# Patient Record
Sex: Male | Born: 1985 | Race: Black or African American | Hispanic: No | Marital: Single | Smoking: Current every day smoker
Health system: Southern US, Community
[De-identification: ages and names within clinical notes are randomized; demographics above are authoritative.]

## PROBLEM LIST (undated history)

## (undated) DIAGNOSIS — F209 Schizophrenia, unspecified: Secondary | ICD-10-CM

## (undated) DIAGNOSIS — F329 Major depressive disorder, single episode, unspecified: Secondary | ICD-10-CM

## (undated) DIAGNOSIS — F32A Depression, unspecified: Secondary | ICD-10-CM

## (undated) DIAGNOSIS — F319 Bipolar disorder, unspecified: Secondary | ICD-10-CM

## (undated) NOTE — *Deleted (*Deleted)
Emergency Medicine Observation Re-evaluation Note  Jeffrey Michael is a 32 y.o. male, seen on rounds today.  Pt initially presented to the ED for complaints of Medication Refill and Psychiatric Evaluation Currently, the patient is ***.  Physical Exam  BP 120/74 (BP Location: Left Arm)   Pulse 60   Temp 98.2 F (36.8 C) (Oral)   Resp 18   SpO2 100%  Physical Exam General: *** Cardiac: *** Lungs: *** Psych: ***  ED Course / MDM  EKG:    I have reviewed the labs performed to date as well as medications administered while in observation.  Recent changes in the last 24 hours include ***.  Plan  Current plan is for ***. Patient {ACTION; IS/IS [id number]} under full IVC at this time.

---

## 2014-07-01 ENCOUNTER — Inpatient Hospital Stay: Payer: Self-pay | Admitting: Psychiatry

## 2014-07-02 LAB — LIPID PANEL
Cholesterol: 88 mg/dL (ref 0–200)
HDL Cholesterol: 38 mg/dL — ABNORMAL LOW (ref 40–60)
LDL CHOLESTEROL, CALC: 40 mg/dL (ref 0–100)
Triglycerides: 50 mg/dL (ref 0–200)
VLDL Cholesterol, Calc: 10 mg/dL (ref 5–40)

## 2014-07-02 LAB — COMPREHENSIVE METABOLIC PANEL
ALBUMIN: 3.4 g/dL (ref 3.4–5.0)
ANION GAP: 7 (ref 7–16)
Alkaline Phosphatase: 69 U/L
BILIRUBIN TOTAL: 0.5 mg/dL (ref 0.2–1.0)
BUN: 8 mg/dL (ref 7–18)
CHLORIDE: 111 mmol/L — AB (ref 98–107)
CO2: 24 mmol/L (ref 21–32)
Calcium, Total: 8.3 mg/dL — ABNORMAL LOW (ref 8.5–10.1)
Creatinine: 0.88 mg/dL (ref 0.60–1.30)
EGFR (African American): >60
EGFR (Non-African Amer.): >60
Glucose: 93 mg/dL (ref 65–99)
OSMOLALITY: 281 (ref 275–301)
POTASSIUM: 3.9 mmol/L (ref 3.5–5.1)
SGOT(AST): 16 U/L (ref 15–37)
SGPT (ALT): 19 U/L
Sodium: 142 mmol/L (ref 136–145)
TOTAL PROTEIN: 6.4 g/dL (ref 6.4–8.2)

## 2014-07-02 LAB — DRUG SCREEN, URINE
AMPHETAMINES, UR SCREEN: NEGATIVE (ref ?–1000)
BENZODIAZEPINE, UR SCRN: NEGATIVE (ref ?–200)
Barbiturates, Ur Screen: NEGATIVE (ref ?–200)
Cannabinoid 50 Ng, Ur ~~LOC~~: NEGATIVE (ref ?–50)
Cocaine Metabolite,Ur ~~LOC~~: NEGATIVE (ref ?–300)
MDMA (ECSTASY) UR SCREEN: NEGATIVE (ref ?–500)
METHADONE, UR SCREEN: NEGATIVE (ref ?–300)
Opiate, Ur Screen: NEGATIVE (ref ?–300)
PHENCYCLIDINE (PCP) UR S: NEGATIVE (ref ?–25)
TRICYCLIC, UR SCREEN: NEGATIVE (ref ?–1000)

## 2014-07-02 LAB — CBC
HCT: 40.1 % (ref 40.0–52.0)
HGB: 12.8 g/dL — ABNORMAL LOW (ref 13.0–18.0)
MCH: 23.7 pg — AB (ref 26.0–34.0)
MCHC: 32 g/dL (ref 32.0–36.0)
MCV: 74 fL — ABNORMAL LOW (ref 80–100)
Platelet: 248 10*3/uL (ref 150–440)
RBC: 5.4 10*6/uL (ref 4.40–5.90)
RDW: 15.7 % — ABNORMAL HIGH (ref 11.5–14.5)
WBC: 4.7 10*3/uL (ref 3.8–10.6)

## 2014-07-02 LAB — HEMOGLOBIN A1C: HEMOGLOBIN A1C: 6.1 % (ref 4.2–6.3)

## 2015-01-22 ENCOUNTER — Encounter (HOSPITAL_COMMUNITY): Payer: Self-pay | Admitting: Cardiology

## 2015-01-22 ENCOUNTER — Emergency Department (HOSPITAL_COMMUNITY): Payer: Medicare Other

## 2015-01-22 ENCOUNTER — Emergency Department (HOSPITAL_COMMUNITY)
Admission: EM | Admit: 2015-01-22 | Discharge: 2015-01-22 | Disposition: A | Discharge status: Home / Self Care | Payer: Medicare Other | Attending: Emergency Medicine | Admitting: Emergency Medicine

## 2015-01-22 DIAGNOSIS — S59912A Unspecified injury of left forearm, initial encounter: Secondary | ICD-10-CM | POA: Diagnosis not present

## 2015-01-22 DIAGNOSIS — Y9289 Other specified places as the place of occurrence of the external cause: Secondary | ICD-10-CM | POA: Diagnosis not present

## 2015-01-22 DIAGNOSIS — S6992XA Unspecified injury of left wrist, hand and finger(s), initial encounter: Secondary | ICD-10-CM | POA: Insufficient documentation

## 2015-01-22 DIAGNOSIS — Y998 Other external cause status: Secondary | ICD-10-CM | POA: Diagnosis not present

## 2015-01-22 DIAGNOSIS — Z87891 Personal history of nicotine dependence: Secondary | ICD-10-CM | POA: Insufficient documentation

## 2015-01-22 DIAGNOSIS — M25532 Pain in left wrist: Secondary | ICD-10-CM | POA: Insufficient documentation

## 2015-01-22 DIAGNOSIS — Y9389 Activity, other specified: Secondary | ICD-10-CM | POA: Diagnosis not present

## 2015-01-22 MED ORDER — OXYCODONE-ACETAMINOPHEN 5-325 MG PO TABS
1.0000 | ORAL_TABLET | Freq: Once | ORAL | Status: AC
  Administered 2015-01-22: 1 via ORAL
  Filled 2015-01-22: qty 1

## 2015-01-22 MED ORDER — HYDROCODONE-ACETAMINOPHEN 5-325 MG PO TABS: 1.0000 | ORAL_TABLET | ORAL | Status: DC | PRN

## 2015-01-22 NOTE — ED Notes (Signed)
Pt verbalized understanding of d/c instructions and prescriptions and has no further questions. Pt stable and VSS upon discharge.

## 2015-01-22 NOTE — ED Provider Notes (Signed)
X-rays viewed by me. Plan analgesics, splint, referral hand surgery  Doug SouSam Kavion Mancinas, MD 01/22/15 1701

## 2015-01-22 NOTE — Discharge Instructions (Signed)
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
## 2015-01-22 NOTE — Progress Notes (Signed)
Orthopedic Tech Progress Note Patient Details:  Jeffrey Michael October 15, 1986 [id number]  Ortho Devices Type of Ortho Device: Ace wrap, Thumb spica splint Splint Material: Fiberglass Ortho Device/Splint Location: LUE Ortho Device/Splint Interventions: Ordered, Application   Jennye MoccasinHughes, Hutch Rhett Craig 01/22/2015, 5:53 PM
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
## 2015-07-26 ENCOUNTER — Encounter (HOSPITAL_COMMUNITY): Payer: Self-pay

## 2015-07-26 ENCOUNTER — Emergency Department (HOSPITAL_COMMUNITY)
Admission: EM | Admit: 2015-07-26 | Discharge: 2015-07-26 | Disposition: A | Discharge status: Home / Self Care | Payer: Medicare Other | Attending: Emergency Medicine | Admitting: Emergency Medicine

## 2015-07-26 DIAGNOSIS — W260XXA Contact with knife, initial encounter: Secondary | ICD-10-CM | POA: Insufficient documentation

## 2015-07-26 DIAGNOSIS — S61012A Laceration without foreign body of left thumb without damage to nail, initial encounter: Secondary | ICD-10-CM | POA: Insufficient documentation

## 2015-07-26 DIAGNOSIS — Z79899 Other long term (current) drug therapy: Secondary | ICD-10-CM | POA: Insufficient documentation

## 2015-07-26 DIAGNOSIS — Z87891 Personal history of nicotine dependence: Secondary | ICD-10-CM | POA: Insufficient documentation

## 2015-07-26 DIAGNOSIS — Y93G9 Activity, other involving cooking and grilling: Secondary | ICD-10-CM | POA: Diagnosis not present

## 2015-07-26 DIAGNOSIS — F319 Bipolar disorder, unspecified: Secondary | ICD-10-CM | POA: Diagnosis not present

## 2015-07-26 DIAGNOSIS — F209 Schizophrenia, unspecified: Secondary | ICD-10-CM | POA: Diagnosis not present

## 2015-07-26 DIAGNOSIS — Y998 Other external cause status: Secondary | ICD-10-CM | POA: Diagnosis not present

## 2015-07-26 DIAGNOSIS — S6992XA Unspecified injury of left wrist, hand and finger(s), initial encounter: Secondary | ICD-10-CM | POA: Diagnosis present

## 2015-07-26 DIAGNOSIS — Y9289 Other specified places as the place of occurrence of the external cause: Secondary | ICD-10-CM | POA: Insufficient documentation

## 2015-07-26 DIAGNOSIS — IMO0002 Reserved for concepts with insufficient information to code with codable children: Secondary | ICD-10-CM

## 2015-07-26 HISTORY — DX: Major depressive disorder, single episode, unspecified: F32.9

## 2015-07-26 HISTORY — DX: Bipolar disorder, unspecified: F31.9

## 2015-07-26 HISTORY — DX: Depression, unspecified: F32.A

## 2015-07-26 HISTORY — DX: Schizophrenia, unspecified: F20.9

## 2015-07-26 MED ORDER — LIDOCAINE-EPINEPHRINE 2 %-1:100000 IJ SOLN
20.0000 mL | Freq: Once | INTRAMUSCULAR | Status: DC
  Filled 2015-07-26: qty 1

## 2015-07-26 NOTE — ED Notes (Signed)
Pt has a laceration above his left thumb while he was cooking
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
## 2015-07-26 NOTE — ED Provider Notes (Signed)
CSN: [id number]     Arrival date & time 07/26/15  2020 History  This chart was scribed for Earley Favor, NP, working with Raeford Razor, MD by Chestine Spore, ED Scribe. The patient was seen in room WTR5/WTR5 at 8:45 PM.    Chief Complaint  Patient presents with  . Extremity Laceration      The history is provided by the patient. No language interpreter was used.    Jeffrey Michael is a 29 y.o. male who presents to the Emergency Department via EMS complaining of left thumb laceration onset 7:30 PM PTA. Pt reports that he cut his left thumb while trying to filet a fish with a filet knife. Pt notes that his tetanus was last updated in 2013-2014. He states that he is having associated symptoms of wound. He denies color change, joint swelling, and any other symptoms.   Past Medical History  Diagnosis Date  . Schizophrenia   . Bipolar 1 disorder   . Depression    History reviewed. No pertinent past surgical history. History reviewed. No pertinent family history. Social History  Substance Use Topics  . Smoking status: Former Games developer  . Smokeless tobacco: None  . Alcohol Use: Yes    Review of Systems  Musculoskeletal: Negative for joint swelling and arthralgias.  Skin: Positive for wound. Negative for color change and rash.      Allergies  Review of patient's allergies indicates no known allergies.  Home Medications   Prior to Admission medications   Medication Sig Start Date End Date Taking? Authorizing Provider  haloperidol (HALDOL) 10 MG tablet Take 10 mg by mouth every evening.    Historical Provider, MD  haloperidol decanoate (HALDOL DECANOATE) 100 MG/ML injection Inject into the muscle every 28 (twenty-eight) days.    Historical Provider, MD  HYDROcodone-acetaminophen (NORCO/VICODIN) 5-325 MG per tablet Take 1 tablet by mouth every 4 (four) hours as needed for moderate pain or severe pain. 01/22/15   Jon Gills, MD  sertraline (ZOLOFT) 100 MG tablet Take 100 mg by mouth every  evening.     Historical Provider, MD  traZODone (DESYREL) 100 MG tablet Take 100 mg by mouth at bedtime. 11/29/14   Historical Provider, MD   BP 116/62 mmHg  Pulse 90  Temp(Src) 98 F (36.7 C) (Oral)  Resp 18  SpO2 98% Physical Exam  Constitutional: He is oriented to person, place, and time. He appears well-developed and well-nourished. No distress.  HENT:  Head: Normocephalic and atraumatic.  Eyes: EOM are normal.  Neck: Neck supple. No tracheal deviation present.  Cardiovascular: Normal rate.   Pulmonary/Chest: Effort normal. No respiratory distress.  Musculoskeletal: Normal range of motion.  Neurological: He is alert and oriented to person, place, and time.  Skin: Skin is warm and dry. Laceration noted.  1 cm laceration to the base of the left thumb. Full ROM. Clean margins.  Psychiatric: He has a normal mood and affect. His behavior is normal.  Nursing note and vitals reviewed.   ED Course  Procedures (including critical care time) DIAGNOSTIC STUDIES: Oxygen Saturation is 100% on {room air, {normal,  by my interpretation.    COORDINATION OF CARE: 8:47 PM Discussed treatment plan with pt at bedside and pt agreed to plan.   LACERATION REPAIR PROCEDURE NOTE The patient's identification was confirmed and consent was obtained. This procedure was performed by Earley Favor, NP at 9:31 PM. Site: Base of left thumb Sterile procedures observed: YES Anesthetic used (type and amt): 2 % Lidocaine with Epinephrine  and 1 cc used Suture type/size:3-0 Prolene Length: 1 cm # of Sutures: 3 Technique:Single interrupted Complexity: SImple Antibx ointment applied: BACITRICIN  Tetanus UTD or ordered: NO Site anesthetized, irrigated with NS, explored without evidence of foreign body, wound well approximated, site covered with dry, sterile dressing.  Patient tolerated procedure well without complications. Instructions for care discussed verbally and patient provided with additional  written instructions for homecare and f/u.   Labs Review Labs Reviewed - No data to display  Imaging Review No results found. I have personally reviewed and evaluated these images and lab results as part of my medical decision-making.   EKG Interpretation None     Patient was instructed to keep the area clean and dry.  Wash with soap and water daily, apply thin coat out antibiotically ointment and have the sutures removed in 10 days MDM   Final diagnoses:  None  I personally performed the services described in this documentation, which was scribed in my presence. The recorded information has been reviewed and is accurate.   Earley Favor, NP 07/26/15 2200  Raeford Razor, MD 07/27/15 [phone]

## 2015-07-26 NOTE — Discharge Instructions (Signed)
He can wash the suture site with soap and water once a day, apply a thin coat of antivirals appointment daily, sutures should be removed in 10 days

## 2015-08-04 ENCOUNTER — Emergency Department (HOSPITAL_COMMUNITY)
Admission: EM | Admit: 2015-08-04 | Discharge: 2015-08-04 | Disposition: A | Discharge status: Home / Self Care | Payer: Medicare Other | Attending: Emergency Medicine | Admitting: Emergency Medicine

## 2015-08-04 DIAGNOSIS — Z87891 Personal history of nicotine dependence: Secondary | ICD-10-CM | POA: Diagnosis not present

## 2015-08-04 DIAGNOSIS — F319 Bipolar disorder, unspecified: Secondary | ICD-10-CM | POA: Diagnosis not present

## 2015-08-04 DIAGNOSIS — Z4802 Encounter for removal of sutures: Secondary | ICD-10-CM | POA: Insufficient documentation

## 2015-08-04 DIAGNOSIS — F209 Schizophrenia, unspecified: Secondary | ICD-10-CM | POA: Insufficient documentation

## 2015-08-04 NOTE — ED Provider Notes (Signed)
CSN: [id number]     Arrival date & time 08/04/15  1135 History  This chart was scribed for non-physician practitioner working with Margarita Grizzle, MD, by Jarvis Morgan, ED Scribe. This patient was seen in room TR07C/TR07C and the patient's care was started at 12:19 PM.     Chief Complaint  Patient presents with  . Suture / Staple Removal    The history is provided by the patient. No language interpreter was used.    HPI Comments: Jeffrey Michael is a 29 y.o. male who presents to the Emergency Department for a suture removal of his left hand. Pt initially lacerated the left hand 9 days ago with a filet knife while trying to filet a fish. There were 3 sutures placed in the ED at that time. He states the area is healing well. He denies nay redness, discharge or fever, numbness or weakness       Past Medical History  Diagnosis Date  . Schizophrenia   . Bipolar 1 disorder   . Depression    No past surgical history on file. No family history on file. Social History  Substance Use Topics  . Smoking status: Former Games developer  . Smokeless tobacco: Not on file  . Alcohol Use: Yes    Review of Systems A 10 point review of systems was completed and was negative except for pertinent positives and negatives as mentioned in the history of present illness     Allergies  Review of patient's allergies indicates no known allergies.  Home Medications   Prior to Admission medications   Medication Sig Start Date End Date Taking? Authorizing Provider  haloperidol (HALDOL) 10 MG tablet Take 10 mg by mouth every evening.    Historical Provider, MD  haloperidol decanoate (HALDOL DECANOATE) 100 MG/ML injection Inject into the muscle every 28 (twenty-eight) days.    Historical Provider, MD  HYDROcodone-acetaminophen (NORCO/VICODIN) 5-325 MG per tablet Take 1 tablet by mouth every 4 (four) hours as needed for moderate pain or severe pain. 01/22/15   Jon Gills, MD  sertraline (ZOLOFT) 100 MG tablet Take 100  mg by mouth every evening.     Historical Provider, MD  traZODone (DESYREL) 100 MG tablet Take 100 mg by mouth at bedtime. 11/29/14   Historical Provider, MD   BP 138/84 mmHg  Pulse 77  Temp(Src) 97.8 F (36.6 C) (Oral)  Resp 16  SpO2 100% Physical Exam  Constitutional:  Awake, alert, nontoxic appearance.  HENT:  Head: Atraumatic.  Eyes: Right eye exhibits no discharge. Left eye exhibits no discharge.  Neck: Neck supple.  Pulmonary/Chest: Effort normal. He exhibits no tenderness.  Abdominal: Soft. There is no tenderness. There is no rebound.  Musculoskeletal: He exhibits no tenderness.  Baseline ROM, no obvious new focal weakness.  Neurological:  Mental status and motor strength appears baseline for patient and situation.  Skin: No rash noted.  Well-healing wound to dorsum of left hand. 3 prolene sutures in place. No cellulitis or other evidence of infection  Psychiatric: He has a normal mood and affect.  Nursing note and vitals reviewed.   ED Course  Procedures (including critical care time) SUTURE REMOVAL Performed by: Sharlene Motts  Consent: Verbal consent obtained. Patient identity confirmed: provided demographic data Time out: Immediately prior to procedure a "time out" was called to verify the correct patient, procedure, equipment, support staff and site/side marked as required.  Location details: Dorsum left hand  Wound Appearance: clean  Sutures/Staples Removed: 3   Facility: sutures  placed in this facility Patient tolerance: Patient tolerated the procedure well with no immediate complications.   Labs Review Labs Reviewed - No data to display  Imaging Review No results found. I have personally reviewed and evaluated these images and lab results as part of my medical decision-making.   EKG Interpretation None      MDM  Patient presents for suture removal. The wound is well healed without signs of infection.  The sutures are removed. Wound care  and activity instructions given. Return prn. Vital stable, patient appropriate for discharge to follow-up with doctors as needed. Final diagnoses:  Visit for suture removal        Joycie Peek, PA-C 08/04/15 1439  Margarita Grizzle, MD 08/05/15 1213

## 2015-08-04 NOTE — Discharge Instructions (Signed)

## 2015-08-04 NOTE — ED Notes (Signed)
Sutures intact left hand. Healing well.

## 2018-02-11 ENCOUNTER — Other Ambulatory Visit: Payer: Self-pay

## 2018-02-11 ENCOUNTER — Encounter (HOSPITAL_COMMUNITY): Payer: Self-pay | Admitting: *Deleted

## 2018-02-11 ENCOUNTER — Emergency Department (HOSPITAL_COMMUNITY)
Admission: EM | Admit: 2018-02-11 | Discharge: 2018-02-11 | Disposition: A | Discharge status: Home / Self Care | Payer: Medicare Other | Attending: Emergency Medicine | Admitting: Emergency Medicine

## 2018-02-11 DIAGNOSIS — Z79899 Other long term (current) drug therapy: Secondary | ICD-10-CM | POA: Insufficient documentation

## 2018-02-11 DIAGNOSIS — M779 Enthesopathy, unspecified: Secondary | ICD-10-CM | POA: Insufficient documentation

## 2018-02-11 DIAGNOSIS — Z87891 Personal history of nicotine dependence: Secondary | ICD-10-CM | POA: Insufficient documentation

## 2018-02-11 DIAGNOSIS — M79605 Pain in left leg: Secondary | ICD-10-CM | POA: Diagnosis present

## 2018-02-11 MED ORDER — IBUPROFEN 600 MG PO TABS: 600.0000 mg | ORAL_TABLET | Freq: Four times a day (QID) | ORAL | 0 refills | Status: DC | PRN

## 2018-02-11 NOTE — ED Triage Notes (Signed)
Pt reports onset  Yesterday of pain to left ankle and left knee. Pt denies any specific injury. Ambulatory at triage. No swelling noted to leg.

## 2018-02-11 NOTE — Discharge Instructions (Signed)
Please read attached information. If you experience any new or worsening signs or symptoms please return to the emergency room for evaluation. Please follow-up with your primary care provider or specialist as discussed. Please use medication prescribed only as directed and discontinue taking if you have any concerning signs or symptoms.   °

## 2018-02-11 NOTE — ED Provider Notes (Signed)
[facility] EMERGENCY DEPARTMENT Provider Note   CSN: [id number] Arrival date & time: 02/11/18  1247   History   Chief Complaint Chief Complaint  Patient presents with  . Leg Pain    HPI Jeffrey Michael is a 32 y.o. male.  HPI   32 year old male presents today with left leg pain.  Patient notes pain at the left lateral knee worse with movement no swelling redness or warmth.  No trauma.  No medications prior to arrival.    Past Medical History:  Diagnosis Date  . Bipolar 1 disorder (HCC)   . Depression   . Schizophrenia (HCC)     There are no active problems to display for this patient.   History reviewed. No pertinent surgical history.     Home Medications    Prior to Admission medications   Medication Sig Start Date End Date Taking? Authorizing Provider  haloperidol (HALDOL) 10 MG tablet Take 10 mg by mouth every evening.    [provider]  haloperidol decanoate (HALDOL DECANOATE) 100 MG/ML injection Inject into the muscle every 28 (twenty-eight) days.    [provider]  HYDROcodone-acetaminophen (NORCO/VICODIN) 5-325 MG per tablet Take 1 tablet by mouth every 4 (four) hours as needed for moderate pain or severe pain. 01/22/15   Jon Gills, MD  ibuprofen (ADVIL,MOTRIN) 600 MG tablet Take 1 tablet (600 mg total) by mouth every 6 (six) hours as needed. 02/11/18   Kanita Delage, Tinnie Gens, PA-C  sertraline (ZOLOFT) 100 MG tablet Take 100 mg by mouth every evening.     [provider]  traZODone (DESYREL) 100 MG tablet Take 100 mg by mouth at bedtime. 11/29/14   [provider]    Family History History reviewed. No pertinent family history.  Social History Social History   Tobacco Use  . Smoking status: Former Smoker  Substance Use Topics  . Alcohol use: Yes  . Drug use: Yes    Types: Marijuana     Allergies   Patient has no known allergies.  Review of Systems Review of Systems  All other systems reviewed  and are negative.  Physical Exam Updated Vital Signs BP 124/85 (BP Location: Right Arm)   Pulse 94   Temp 98 F (36.7 C) (Oral)   Resp 16   Ht 5\' 11"  (1.803 m)   Wt 65.8 kg (145 lb)   SpO2 99%   BMI 20.22 kg/m    Physical Exam  Constitutional: He is oriented to person, place, and time. He appears well-developed and well-nourished.  HENT:  Head: Normocephalic and atraumatic.  Eyes: Conjunctivae are normal. Pupils are equal, round, and reactive to light. Right eye exhibits no discharge. Left eye exhibits no discharge. No scleral icterus.  Neck: Normal range of motion. No JVD present. No tracheal deviation present.  Pulmonary/Chest: Effort normal. No stridor.  Musculoskeletal:  Tenderness palpation left lateral knee and lateral musculature, no swelling or edema, full active range of motion no laxity no signs of trauma  Neurological: He is alert and oriented to person, place, and time. Coordination normal.  Psychiatric: He has a normal mood and affect. His behavior is normal. Judgment and thought content normal.  Nursing note and vitals reviewed.   ED Treatments / Results  Labs (all labs ordered are listed, but only abnormal results are displayed) Labs Reviewed - No data to display  EKG  EKG Interpretation None       Radiology No results found.  Procedures Procedures (including critical care time)  Medications Ordered in ED Medications - No data to display   Initial Impression / Assessment and Plan / ED Course  I have reviewed the triage vital signs and the nursing notes.  Pertinent labs & imaging results that were available during my care of the patient were reviewed by me and considered in my medical decision making (see chart for details).      Final Clinical Impressions(s) / ED Diagnoses   Final diagnoses:  Tendinitis   Labs:   Imaging:  Consults:  Therapeutics:  Discharge Meds: Ibuprofen  Assessment/Plan: 32 year old male with likely  tendinitis.  Symptomatic care instructions given strict return precautions given.  He verbalized understanding and agreement to today's plan.    ED Discharge Orders        Ordered    ibuprofen (ADVIL,MOTRIN) 600 MG tablet  Every 6 hours PRN     02/11/18 1441       Eyvonne MechanicHedges, Bethzy Hauck, PA-C 02/11/18 1727    Long, Arlyss RepressJoshua G, MD 02/11/18 2312

## 2018-03-16 ENCOUNTER — Other Ambulatory Visit: Payer: Self-pay

## 2018-03-16 ENCOUNTER — Encounter (HOSPITAL_COMMUNITY): Payer: Self-pay | Admitting: Emergency Medicine

## 2018-03-16 ENCOUNTER — Emergency Department (HOSPITAL_COMMUNITY)
Admission: EM | Admit: 2018-03-16 | Discharge: 2018-03-16 | Disposition: A | Discharge status: Home / Self Care | Payer: Medicare Other | Attending: Emergency Medicine | Admitting: Emergency Medicine

## 2018-03-16 DIAGNOSIS — Y93E8 Activity, other personal hygiene: Secondary | ICD-10-CM | POA: Insufficient documentation

## 2018-03-16 DIAGNOSIS — Z79899 Other long term (current) drug therapy: Secondary | ICD-10-CM | POA: Insufficient documentation

## 2018-03-16 DIAGNOSIS — Y999 Unspecified external cause status: Secondary | ICD-10-CM | POA: Insufficient documentation

## 2018-03-16 DIAGNOSIS — Y929 Unspecified place or not applicable: Secondary | ICD-10-CM | POA: Insufficient documentation

## 2018-03-16 DIAGNOSIS — F17200 Nicotine dependence, unspecified, uncomplicated: Secondary | ICD-10-CM | POA: Diagnosis not present

## 2018-03-16 DIAGNOSIS — T161XXA Foreign body in right ear, initial encounter: Secondary | ICD-10-CM | POA: Diagnosis not present

## 2018-03-16 DIAGNOSIS — X58XXXA Exposure to other specified factors, initial encounter: Secondary | ICD-10-CM | POA: Diagnosis not present

## 2018-03-16 DIAGNOSIS — S09301A Unspecified injury of right middle and inner ear, initial encounter: Secondary | ICD-10-CM | POA: Diagnosis present

## 2018-03-16 MED ORDER — OFLOXACIN 0.3 % OT SOLN: 10.0000 [drp] | Freq: Every day | OTIC | 0 refills | Status: AC

## 2018-03-16 NOTE — ED Provider Notes (Signed)
[facility] EMERGENCY DEPARTMENT Provider Note   CSN: [id number] Arrival date & time: 03/16/18  1418     History   Chief Complaint Chief Complaint  Patient presents with  . Foreign Body in Ear    HPI Jeffrey Michael is a 32 y.o. male presenting to the ED with foreign body in his right ear.  Patient states a week ago he was using a Q-tip and lost the cotton and in his right ear.  He states he has been unable to move it since that time.  States he has decreased hearing associated with this and right ear pain.  Denies any drainage.  The history is provided by the patient.    Past Medical History:  Diagnosis Date  . Bipolar 1 disorder (HCC)   . Depression   . Schizophrenia (HCC)     There are no active problems to display for this patient.   History reviewed. No pertinent surgical history.      Home Medications    Prior to Admission medications   Medication Sig Start Date End Date Taking? Authorizing Provider  haloperidol (HALDOL) 10 MG tablet Take 10 mg by mouth every evening.    [provider]  haloperidol decanoate (HALDOL DECANOATE) 100 MG/ML injection Inject into the muscle every 28 (twenty-eight) days.    [provider]  HYDROcodone-acetaminophen (NORCO/VICODIN) 5-325 MG per tablet Take 1 tablet by mouth every 4 (four) hours as needed for moderate pain or severe pain. 01/22/15   Jon Gills, MD  ibuprofen (ADVIL,MOTRIN) 600 MG tablet Take 1 tablet (600 mg total) by mouth every 6 (six) hours as needed. 02/11/18   Hedges, Tinnie Gens, PA-C  ofloxacin (FLOXIN) 0.3 % OTIC solution Place 10 drops into the right ear daily for 7 days. 03/16/18 03/23/18  Doyle Tegethoff, [place] N, PA-C  sertraline (ZOLOFT) 100 MG tablet Take 100 mg by mouth every evening.     [provider]  traZODone (DESYREL) 100 MG tablet Take 100 mg by mouth at bedtime. 11/29/14   [provider]    Family History No family history on file.  Social  History Social History   Tobacco Use  . Smoking status: Current Every Day Smoker  Substance Use Topics  . Alcohol use: Yes  . Drug use: Yes    Types: Marijuana     Allergies   Patient has no known allergies.   Review of Systems Review of Systems  HENT: Positive for ear pain. Negative for ear discharge.        Foreign body right ear  All other systems reviewed and are negative.    Physical Exam Updated Vital Signs BP (!) 135/97 (BP Location: Right Arm)   Pulse 84   Temp (!) 97.5 F (36.4 C) (Oral)   Resp 16   Ht 5\' 11"  (1.803 m)   Wt 65.8 kg (145 lb)   SpO2 100%   BMI 20.22 kg/m   Physical Exam  Constitutional: He appears well-developed and well-nourished.  HENT:  Head: Normocephalic and atraumatic.  Left Ear: Tympanic membrane, external ear and ear canal normal.  Right canal with foreign object visualized.  Unable to visualize TM.  Canal is mildly erythematous, without obvious discharge.  Eyes: Conjunctivae are normal.  Pulmonary/Chest: Effort normal.  Psychiatric: He has a normal mood and affect. His behavior is normal.  Nursing note and vitals reviewed.    ED Treatments / Results  Labs (all labs ordered are listed, but only abnormal results are displayed)  Labs Reviewed - No data to display  EKG None  Radiology No results found.  Procedures .Foreign Body Removal Date/Time: 03/16/2018 3:59 PM Performed by: Lottie Sigman, [place] N, PA-C Authorized by: Kmari Brian, [place] N, PA-C  Consent: Verbal consent obtained. Risks and benefits: risks, benefits and alternatives were discussed Consent given by: patient Patient understanding: patient states understanding of the procedure being performed Patient identity confirmed: verbally with patient Body area: ear Location details: right ear Localization method: ENT speculum Removal mechanism: forceps Complexity: simple 1 objects recovered. Objects recovered: 1 cm peice of cotton Post-procedure assessment:  foreign body removed Patient tolerance: Patient tolerated the procedure well with no immediate complications Comments: Object removed in whole. TM without perforation or effusion noted. Canal with some erythema.   (including critical care time)  Medications Ordered in ED Medications - No data to display   Initial Impression / Assessment and Plan / ED Course  I have reviewed the triage vital signs and the nursing notes.  Pertinent labs & imaging results that were available during my care of the patient were reviewed by me and considered in my medical decision making (see chart for details).     Patient presenting to the ED with cotton tip and in his right ear canal.  Foreign body was removed in whole.  TM visualized following removal, without evidence of perforation or effusion.  Canal with erythema, however no obvious drainage.  Will cover with ofloxacin drops given duration the foreign object was his ear. Return precautions discussed.  Discussed results, findings, treatment and follow up. Patient advised of return precautions. Patient verbalized understanding and agreed with plan.  Final Clinical Impressions(s) / ED Diagnoses   Final diagnoses:  Foreign body of right ear, initial encounter    ED Discharge Orders        Ordered    ofloxacin (FLOXIN) 0.3 % OTIC solution  Daily     03/16/18 1557       Odell Fasching, [place] N, PA-C 03/16/18 1605    Margarita Grizzleay, Danielle, MD 03/16/18 [phone]

## 2018-03-16 NOTE — ED Triage Notes (Signed)
Pt presents top ED with qtip cotton stuck in ear. C/O jaw and tooth pain. Reports small amount of blood in ear.

## 2018-03-16 NOTE — Discharge Instructions (Signed)
Apply 10 drops of the antibiotic to your right ear, once daily, for 1 week. Return to the ER for new or concerning symptoms.

## 2018-06-16 ENCOUNTER — Telehealth: Payer: Self-pay | Admitting: Internal Medicine

## 2018-06-16 ENCOUNTER — Encounter: Payer: Self-pay | Admitting: Internal Medicine

## 2018-06-16 NOTE — Telephone Encounter (Signed)
Pt has been scheduled to see Dr. Arbutus PedMohamed on 8/6 at 1130am. Letter mailed to the pt and faxed to the referring to notify the pt.

## 2018-07-07 ENCOUNTER — Inpatient Hospital Stay: Payer: Medicare Other | Attending: Internal Medicine | Admitting: Internal Medicine

## 2018-07-07 ENCOUNTER — Other Ambulatory Visit: Payer: Self-pay | Admitting: Internal Medicine

## 2018-07-07 DIAGNOSIS — D539 Nutritional anemia, unspecified: Secondary | ICD-10-CM

## 2019-11-18 ENCOUNTER — Emergency Department (HOSPITAL_COMMUNITY): Payer: 59

## 2019-11-18 ENCOUNTER — Encounter (HOSPITAL_COMMUNITY): Payer: Self-pay | Admitting: Emergency Medicine

## 2019-11-18 ENCOUNTER — Emergency Department (HOSPITAL_COMMUNITY)
Admission: EM | Admit: 2019-11-18 | Discharge: 2019-11-18 | Disposition: A | Discharge status: Home / Self Care | Payer: 59 | Attending: Emergency Medicine | Admitting: Emergency Medicine

## 2019-11-18 DIAGNOSIS — R040 Epistaxis: Secondary | ICD-10-CM | POA: Insufficient documentation

## 2019-11-18 DIAGNOSIS — F17200 Nicotine dependence, unspecified, uncomplicated: Secondary | ICD-10-CM | POA: Diagnosis not present

## 2019-11-18 DIAGNOSIS — J3489 Other specified disorders of nose and nasal sinuses: Secondary | ICD-10-CM | POA: Diagnosis present

## 2019-11-18 DIAGNOSIS — B349 Viral infection, unspecified: Secondary | ICD-10-CM

## 2019-11-18 DIAGNOSIS — Z20828 Contact with and (suspected) exposure to other viral communicable diseases: Secondary | ICD-10-CM | POA: Insufficient documentation

## 2019-11-18 DIAGNOSIS — Z79899 Other long term (current) drug therapy: Secondary | ICD-10-CM | POA: Insufficient documentation

## 2019-11-18 DIAGNOSIS — Z20822 Contact with and (suspected) exposure to covid-19: Secondary | ICD-10-CM

## 2019-11-18 LAB — COMPREHENSIVE METABOLIC PANEL
ALT: 32 U/L (ref 0–44)
AST: 23 U/L (ref 15–41)
Albumin: 3.6 g/dL (ref 3.5–5.0)
Alkaline Phosphatase: 75 U/L (ref 38–126)
Anion gap: 7 (ref 5–15)
BUN: 12 mg/dL (ref 6–20)
CO2: 24 mmol/L (ref 22–32)
Calcium: 8.8 mg/dL — ABNORMAL LOW (ref 8.9–10.3)
Chloride: 107 mmol/L (ref 98–111)
Creatinine, Ser: 0.87 mg/dL (ref 0.61–1.24)
GFR calc Af Amer: >60 mL/min (ref >60)
GFR calc non Af Amer: >60 mL/min (ref >60)
Glucose, Bld: 104 mg/dL — ABNORMAL HIGH (ref 70–99)
Potassium: 4.2 mmol/L (ref 3.5–5.1)
Sodium: 138 mmol/L (ref 135–145)
Total Bilirubin: 0.2 mg/dL — ABNORMAL LOW (ref 0.3–1.2)
Total Protein: 6.8 g/dL (ref 6.5–8.1)

## 2019-11-18 LAB — CBC
HCT: 39.6 % (ref 39.0–52.0)
Hemoglobin: 12.3 g/dL — ABNORMAL LOW (ref 13.0–17.0)
MCH: 23.3 pg — ABNORMAL LOW (ref 26.0–34.0)
MCHC: 31.1 g/dL (ref 30.0–36.0)
MCV: 75.1 fL — ABNORMAL LOW (ref 80.0–100.0)
Platelets: 375 10*3/uL (ref 150–400)
RBC: 5.27 MIL/uL (ref 4.22–5.81)
RDW: 15.3 % (ref 11.5–15.5)
WBC: 5.7 10*3/uL (ref 4.0–10.5)
nRBC: 0 % (ref 0.0–0.2)

## 2019-11-18 LAB — INFLUENZA PANEL BY PCR (TYPE A & B)
Influenza A By PCR: NEGATIVE
Influenza B By PCR: NEGATIVE

## 2019-11-18 LAB — RAPID URINE DRUG SCREEN, HOSP PERFORMED
Amphetamines: NOT DETECTED
Barbiturates: NOT DETECTED
Benzodiazepines: NOT DETECTED
Cocaine: NOT DETECTED
Opiates: NOT DETECTED
Tetrahydrocannabinol: NOT DETECTED

## 2019-11-18 LAB — ETHANOL: Alcohol, Ethyl (B): <10 mg/dL (ref <10)

## 2019-11-18 NOTE — ED Notes (Signed)
Per report from offgoing shift, pt refused POC covid swab. PA aware.

## 2019-11-18 NOTE — ED Provider Notes (Signed)
[facility] Provider Note   CSN: [id number] Arrival date & time: 11/18/19  0844     History Chief Complaint  Patient presents with  . URI    Marcelle Overlieric D Knee is a 33 y.o. male history of bipolar, schizophrenia.  Patient presents today for concerns of "the flu".  He reports he has been having cold symptoms off and on for approximately 1 month including runny nose, intermittent cramps and intermittent epistaxis.  He reports that he had an outpatient Covid test recently and does not want to be tested for Covid today he would like to be tested for flu.  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  Patient describes cramps as brief aching cramp-like sensation mild self resolving nonradiating no aggravating or alleviating factors that occurred all different parts of his body at different times over the last  On my evaluation patient denies fever/chills, headache/vision changes, neck stiffness, chest pain/shortness of breath, hemoptysis, cough, abdominal pain, nausea/vomiting, diarrhea, extremity swelling/color change, numbness/tingling, weakness, suicidal ideations, homicidal ideations, hallucinations, drug/alcohol use or any additional concerns.  HPI     Past Medical History:  Diagnosis Date  . Bipolar 1 disorder (HCC)   . Depression   . Schizophrenia (HCC)     There are no problems to display for this patient.   History reviewed. No pertinent surgical history.     History reviewed. No pertinent family history.  Social History   Tobacco Use  . Smoking status: Current Every Day Smoker  . Smokeless tobacco: Never Used  Substance Use Topics  . Alcohol use: Yes  . Drug use: Yes    Types: Marijuana    Home Medications Prior to Admission medications   Medication Sig Start Date End Date Taking? Authorizing Provider  haloperidol (HALDOL) 10 MG tablet Take 10 mg by mouth every  evening.    [provider]  haloperidol decanoate (HALDOL DECANOATE) 100 MG/ML injection Inject into the muscle every 28 (twenty-eight) days.    [provider]  HYDROcodone-acetaminophen (NORCO/VICODIN) 5-325 MG per tablet Take 1 tablet by mouth every 4 (four) hours as needed for moderate pain or severe pain. 01/22/15   Jon GillsWebb, Zach, MD  ibuprofen (ADVIL,MOTRIN) 600 MG tablet Take 1 tablet (600 mg total) by mouth every 6 (six) hours as needed. 02/11/18   Hedges, Tinnie GensJeffrey, PA-C  sertraline (ZOLOFT) 100 MG tablet Take 100 mg by mouth every evening.     [provider]  traZODone (DESYREL) 100 MG tablet Take 100 mg by mouth at bedtime. 11/29/14   [provider]    Allergies    Patient has no known allergies.  Review of Systems   Review of Systems Ten systems are reviewed and are negative for acute change except as noted in the HPI  Physical Exam Updated Vital Signs BP 129/80 (BP Location: Left Arm)   Pulse 81   Temp 98.2 F (36.8 C) (Oral)   Resp 16   SpO2 100%   Physical Exam Constitutional:      General: He is not in acute distress.    Appearance: Normal appearance. He is well-developed. He is not ill-appearing or diaphoretic.  HENT:     Head: Normocephalic and atraumatic.     Right Ear: External ear normal.     Left Ear: External ear normal.     Nose: Nose normal.  Eyes:     General: Vision grossly intact.  Gaze aligned appropriately.     Pupils: Pupils are equal, round, and reactive to light.  Neck:     Trachea: Trachea and phonation normal. No tracheal deviation.  Pulmonary:     Effort: Pulmonary effort is normal. No respiratory distress.  Abdominal:     General: There is no distension.     Palpations: Abdomen is soft.     Tenderness: There is no abdominal tenderness. There is no guarding or rebound.  Musculoskeletal:        General: Normal range of motion.     Cervical back: Normal range of motion.  Skin:    General: Skin is warm and  dry.  Neurological:     Mental Status: He is alert.     GCS: GCS eye subscore is 4. GCS verbal subscore is 5. GCS motor subscore is 6.     Comments: Speech is clear and goal oriented, follows commands Major Cranial nerves without deficit, no facial droop Moves extremities without ataxia, coordination intact  Psychiatric:        Behavior: Behavior normal.        Thought Content: Thought content does not include homicidal or suicidal ideation.     ED Results / Procedures / Treatments   Labs (all labs ordered are listed, but only abnormal results are displayed) Labs Reviewed  COMPREHENSIVE METABOLIC PANEL - Abnormal; Notable for the following components:      Result Value   Glucose, Bld 104 (*)    Calcium 8.8 (*)    Total Bilirubin 0.2 (*)    All other components within normal limits  CBC - Abnormal; Notable for the following components:   Hemoglobin 12.3 (*)    MCV 75.1 (*)    MCH 23.3 (*)    All other components within normal limits  ETHANOL  RAPID URINE DRUG SCREEN, HOSP PERFORMED  INFLUENZA PANEL BY PCR (TYPE A & B)  POC SARS CORONAVIRUS 2 AG -  ED    EKG None  Radiology DG Abdomen Acute W/Chest  Result Date: 11/18/2019 CLINICAL DATA:  Generalized abdominal pain. EXAM: DG ABDOMEN ACUTE W/ 1V CHEST COMPARISON:  None. FINDINGS: There is no evidence of dilated bowel loops or free intraperitoneal air. No radiopaque calculi or other significant radiographic abnormality is seen. Heart size and mediastinal contours are within normal limits. Both lungs are clear. IMPRESSION: Negative abdominal radiographs.  No acute cardiopulmonary disease. Electronically Signed   By: Marijo Conception M.D.   On: 11/18/2019 13:03    Procedures Procedures (including critical care time)  Medications Ordered in ED Medications - No data to display  ED Course  I have reviewed the triage vital signs and the nursing notes.  Pertinent labs & imaging results that were available during my care of  the patient were reviewed by me and considered in my medical decision making (see chart for details).    MDM Rules/Calculators/A&P                      CBC nonacute, platelet count within normal limits, hemoglobin 12.3 slightly decreased MCV and MCH appears similar to prior CMP nonacute Flu panel negative Ethanol negative UDS negative Chest/abdomen x-ray:  IMPRESSION:  Negative abdominal radiographs. No acute cardiopulmonary disease.   - Patient refused COVID-19 test today stating that he already had a test earlier this week.  I am unable to find that test in our system.  I offered patient Covid test multiple times and he still refused.  I advised the patient should continue wearing his mask and self isolate to avoid spread of any potential virus and to continue doing so until symptom-free x7 days.  Patient is without tachycardia or hypoxia on room air he has no increased work of breathing on examination and clear lungs no indication for further work-up at this time.  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  He has no abdominal pain or diarrhea, feel he can follow-up with a primary care provider for further evaluation, no indication for emergent imaging or further testing at this time.  Patient denies any SI or HI, he does not appear to be danger to himself or others and appears safe for discharge at this time.  At this time there does not appear to be any evidence of an acute emergency medical condition and the patient appears stable for discharge with appropriate outpatient follow up. Diagnosis was discussed with patient who verbalizes understanding of care plan and is agreeable to discharge. I have discussed return precautions with patient who verbalizes understanding of return precautions. Patient encouraged to follow-up with their  PCP. All questions answered.  Patient has been discharged in good condition.  Patient's case discussed with Dr. Lockie Mola who agrees with plan to discharge with follow-up.   Jeffrey Michael was evaluated in Emergency Department on 11/18/2019 for the symptoms described in the history of present illness. He was evaluated in the context of the global COVID-19 pandemic, which necessitated consideration that the patient might be at risk for infection with the SARS-CoV-2 virus that causes COVID-19. Institutional protocols and algorithms that pertain to the evaluation of patients at risk for COVID-19 are in a state of rapid change based on information released by regulatory bodies including the CDC and federal and state organizations. These policies and algorithms were followed during the patient's care in the ED.  Note: Portions of this report may have been transcribed using voice recognition software. Every effort was made to ensure accuracy; however, inadvertent computerized transcription errors may still be present. Final Clinical Impression(s) / ED Diagnoses Final diagnoses:  Viral illness  Suspected COVID-19 virus infection    Rx / DC Orders ED Discharge Orders    None       Bill Salinas, PA-C 11/18/19 1322    Virgina Norfolk, DO 11/18/19 1552

## 2019-11-18 NOTE — ED Notes (Signed)
He reports he is allergic to his medications and cannot take them, reports last doses were about a month ago.

## 2019-11-18 NOTE — Discharge Instructions (Addendum)
You have been diagnosed today with Viral illness, suspected COVID-19 virus infection.  At this time there does not appear to be the presence of an emergent medical condition, however there is always the potential for conditions to change. Please read and follow the below instructions.  Please return to the Emergency Department immediately for any new or worsening symptoms. Please be sure to follow up with your Primary Care Provider within one week regarding your visit today; please call their office to schedule an appointment even if you are feeling better for a follow-up visit.  Please call [facility] to establish a primary care provider if you do not already have one. Please continue to wear a mask and to isolate to avoid spread of any potential virus.  Please drink plenty of water and get plenty of rest.  You refused COVID-19 test today, you may follow-up as an outpatient to get a Covid test in our community.  You may return to the ER for further evaluation at any time.  Get help right away if: You have trouble breathing. You have pain or pressure in your chest. You have confusion. You have bluish lips and fingernails. You have difficulty waking from sleep. You have trouble breathing. You have a severe headache or a stiff neck. You have severe vomiting or abdominal pain. You have any new/concerning or worsening of symptoms   Please read the additional information packets attached to your discharge summary.  Do not take your medicine if  develop an itchy rash, swelling in your mouth or lips, or difficulty breathing; call 911 and seek immediate emergency medical attention if this occurs.  Note: Portions of this text may have been transcribed using voice recognition software. Every effort was made to ensure accuracy; however, inadvertent computerized transcription errors may still be present.

## 2019-11-18 NOTE — ED Triage Notes (Signed)
Pt to ER for evaluation of cold symptoms, reports has had a COVID test and was negative. Also reports, he is concerned he has tape worms, reports he has not used drugs in 2 days. When asked what drugs he prefers, he states everything. Vitals are stable. He is calm and cooperative.

## 2020-02-23 ENCOUNTER — Encounter (HOSPITAL_COMMUNITY): Payer: Self-pay | Admitting: Psychiatry

## 2020-02-23 ENCOUNTER — Other Ambulatory Visit: Payer: Self-pay

## 2020-02-23 ENCOUNTER — Inpatient Hospital Stay (HOSPITAL_COMMUNITY)
Admission: AD | Admit: 2020-02-23 | Discharge: 2020-02-28 | DRG: 885 | Disposition: A | Discharge status: Home / Self Care | Payer: 59 | Attending: Psychiatry | Admitting: Psychiatry

## 2020-02-23 DIAGNOSIS — Z79899 Other long term (current) drug therapy: Secondary | ICD-10-CM

## 2020-02-23 DIAGNOSIS — F209 Schizophrenia, unspecified: Secondary | ICD-10-CM | POA: Diagnosis present

## 2020-02-23 DIAGNOSIS — G4700 Insomnia, unspecified: Secondary | ICD-10-CM | POA: Diagnosis present

## 2020-02-23 DIAGNOSIS — Z9114 Patient's other noncompliance with medication regimen: Secondary | ICD-10-CM

## 2020-02-23 DIAGNOSIS — F17210 Nicotine dependence, cigarettes, uncomplicated: Secondary | ICD-10-CM | POA: Diagnosis present

## 2020-02-23 DIAGNOSIS — Z20822 Contact with and (suspected) exposure to covid-19: Secondary | ICD-10-CM | POA: Diagnosis present

## 2020-02-23 DIAGNOSIS — E119 Type 2 diabetes mellitus without complications: Secondary | ICD-10-CM | POA: Diagnosis present

## 2020-02-23 DIAGNOSIS — Z915 Personal history of self-harm: Secondary | ICD-10-CM

## 2020-02-23 DIAGNOSIS — F419 Anxiety disorder, unspecified: Secondary | ICD-10-CM | POA: Diagnosis present

## 2020-02-23 DIAGNOSIS — F29 Unspecified psychosis not due to a substance or known physiological condition: Secondary | ICD-10-CM | POA: Diagnosis present

## 2020-02-23 DIAGNOSIS — F319 Bipolar disorder, unspecified: Principal | ICD-10-CM | POA: Diagnosis present
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
## 2020-02-24 DIAGNOSIS — G4700 Insomnia, unspecified: Secondary | ICD-10-CM | POA: Diagnosis present

## 2020-02-24 DIAGNOSIS — Z79899 Other long term (current) drug therapy: Secondary | ICD-10-CM | POA: Diagnosis not present

## 2020-02-24 DIAGNOSIS — F419 Anxiety disorder, unspecified: Secondary | ICD-10-CM | POA: Diagnosis present

## 2020-02-24 DIAGNOSIS — F319 Bipolar disorder, unspecified: Secondary | ICD-10-CM | POA: Diagnosis present

## 2020-02-24 DIAGNOSIS — Z915 Personal history of self-harm: Secondary | ICD-10-CM | POA: Diagnosis not present

## 2020-02-24 DIAGNOSIS — F209 Schizophrenia, unspecified: Secondary | ICD-10-CM | POA: Diagnosis present

## 2020-02-24 DIAGNOSIS — E119 Type 2 diabetes mellitus without complications: Secondary | ICD-10-CM | POA: Diagnosis present

## 2020-02-24 DIAGNOSIS — Z20822 Contact with and (suspected) exposure to covid-19: Secondary | ICD-10-CM | POA: Diagnosis present

## 2020-02-24 DIAGNOSIS — F29 Unspecified psychosis not due to a substance or known physiological condition: Secondary | ICD-10-CM | POA: Diagnosis present

## 2020-02-24 DIAGNOSIS — Z9114 Patient's other noncompliance with medication regimen: Secondary | ICD-10-CM | POA: Diagnosis not present

## 2020-02-24 DIAGNOSIS — F17210 Nicotine dependence, cigarettes, uncomplicated: Secondary | ICD-10-CM | POA: Diagnosis present

## 2020-02-24 LAB — URINALYSIS, ROUTINE W REFLEX MICROSCOPIC
Bilirubin Urine: NEGATIVE
Glucose, UA: NEGATIVE mg/dL
Hgb urine dipstick: NEGATIVE
Ketones, ur: NEGATIVE mg/dL
Leukocytes,Ua: NEGATIVE
Nitrite: NEGATIVE
Protein, ur: NEGATIVE mg/dL
Specific Gravity, Urine: 1.024 (ref 1.005–1.030)
pH: 5 (ref 5.0–8.0)

## 2020-02-24 LAB — COMPREHENSIVE METABOLIC PANEL
ALT: 25 U/L (ref 0–44)
AST: 21 U/L (ref 15–41)
Albumin: 4.3 g/dL (ref 3.5–5.0)
Alkaline Phosphatase: 80 U/L (ref 38–126)
Anion gap: 7 (ref 5–15)
BUN: 16 mg/dL (ref 6–20)
CO2: 26 mmol/L (ref 22–32)
Calcium: 9.4 mg/dL (ref 8.9–10.3)
Chloride: 105 mmol/L (ref 98–111)
Creatinine, Ser: 0.76 mg/dL (ref 0.61–1.24)
GFR calc Af Amer: >60 mL/min (ref >60)
GFR calc non Af Amer: >60 mL/min (ref >60)
Glucose, Bld: 94 mg/dL (ref 70–99)
Potassium: 4.3 mmol/L (ref 3.5–5.1)
Sodium: 138 mmol/L (ref 135–145)
Total Bilirubin: 0.8 mg/dL (ref 0.3–1.2)
Total Protein: 7.2 g/dL (ref 6.5–8.1)

## 2020-02-24 LAB — RAPID URINE DRUG SCREEN, HOSP PERFORMED
Amphetamines: NOT DETECTED
Barbiturates: NOT DETECTED
Benzodiazepines: NOT DETECTED
Cocaine: NOT DETECTED
Opiates: NOT DETECTED
Tetrahydrocannabinol: NOT DETECTED

## 2020-02-24 LAB — RESPIRATORY PANEL BY RT PCR (FLU A&B, COVID)
Influenza A by PCR: NEGATIVE
Influenza B by PCR: NEGATIVE
SARS Coronavirus 2 by RT PCR: NEGATIVE

## 2020-02-24 LAB — CBC
HCT: 43.9 % (ref 39.0–52.0)
Hemoglobin: 13.4 g/dL (ref 13.0–17.0)
MCH: 23.2 pg — ABNORMAL LOW (ref 26.0–34.0)
MCHC: 30.5 g/dL (ref 30.0–36.0)
MCV: 76 fL — ABNORMAL LOW (ref 80.0–100.0)
Platelets: 288 10*3/uL (ref 150–400)
RBC: 5.78 MIL/uL (ref 4.22–5.81)
RDW: 16.1 % — ABNORMAL HIGH (ref 11.5–15.5)
WBC: 3.3 10*3/uL — ABNORMAL LOW (ref 4.0–10.5)
nRBC: 0 % (ref 0.0–0.2)

## 2020-02-24 LAB — LIPID PANEL
Cholesterol: 112 mg/dL (ref 0–200)
HDL: 35 mg/dL — ABNORMAL LOW (ref >40)
LDL Cholesterol: 63 mg/dL (ref 0–99)
Total CHOL/HDL Ratio: 3.2 RATIO
Triglycerides: 69 mg/dL (ref <150)
VLDL: 14 mg/dL (ref 0–40)

## 2020-02-24 LAB — TSH: TSH: 2.046 u[IU]/mL (ref 0.350–4.500)

## 2020-02-24 LAB — HEMOGLOBIN A1C
Hgb A1c MFr Bld: 6.3 % — ABNORMAL HIGH (ref 4.8–5.6)
Mean Plasma Glucose: 134.11 mg/dL

## 2020-02-24 MED ORDER — OLANZAPINE 10 MG PO TBDP: 10.0000 mg | ORAL_TABLET | Freq: Three times a day (TID) | ORAL | Status: DC | PRN

## 2020-02-24 MED ORDER — CARIPRAZINE HCL 3 MG PO CAPS
3.0000 mg | ORAL_CAPSULE | Freq: Every day | ORAL | Status: DC
  Filled 2020-02-24 (×3): qty 1

## 2020-02-24 MED ORDER — MAGNESIUM HYDROXIDE 400 MG/5ML PO SUSP: 30.0000 mL | Freq: Every day | ORAL | Status: DC | PRN

## 2020-02-24 MED ORDER — ACETAMINOPHEN 325 MG PO TABS: 650.0000 mg | ORAL_TABLET | Freq: Four times a day (QID) | ORAL | Status: DC | PRN

## 2020-02-24 MED ORDER — OLANZAPINE 10 MG PO TBDP
10.0000 mg | ORAL_TABLET | Freq: Every day | ORAL | Status: DC
  Administered 2020-02-24 – 2020-02-28 (×5): 10 mg via ORAL
  Filled 2020-02-24 (×8): qty 1

## 2020-02-24 MED ORDER — ZIPRASIDONE MESYLATE 20 MG IM SOLR: 20.0000 mg | INTRAMUSCULAR | Status: DC | PRN

## 2020-02-24 MED ORDER — HYDROXYZINE HCL 25 MG PO TABS
25.0000 mg | ORAL_TABLET | Freq: Three times a day (TID) | ORAL | Status: DC | PRN
  Administered 2020-02-24 – 2020-02-25 (×2): 25 mg via ORAL
  Filled 2020-02-24 (×2): qty 1

## 2020-02-24 MED ORDER — OLANZAPINE 5 MG PO TBDP
15.0000 mg | ORAL_TABLET | Freq: Every day | ORAL | Status: DC
  Administered 2020-02-24 – 2020-02-27 (×4): 15 mg via ORAL
  Filled 2020-02-24 (×7): qty 3

## 2020-02-24 MED ORDER — LORAZEPAM 1 MG PO TABS: 1.0000 mg | ORAL_TABLET | ORAL | Status: DC | PRN

## 2020-02-24 MED ORDER — ALUM & MAG HYDROXIDE-SIMETH 200-200-20 MG/5ML PO SUSP: 30.0000 mL | ORAL | Status: DC | PRN

## 2020-02-24 MED ORDER — HALOPERIDOL 5 MG PO TABS
5.0000 mg | ORAL_TABLET | Freq: Two times a day (BID) | ORAL | Status: DC
  Filled 2020-02-24 (×6): qty 1

## 2020-02-24 MED ORDER — SERTRALINE HCL 50 MG PO TABS
50.0000 mg | ORAL_TABLET | Freq: Every day | ORAL | Status: DC
  Administered 2020-02-25 – 2020-02-28 (×4): 50 mg via ORAL
  Filled 2020-02-24 (×7): qty 1

## 2020-02-24 MED ORDER — HALOPERIDOL LACTATE 5 MG/ML IJ SOLN
10.0000 mg | Freq: Two times a day (BID) | INTRAMUSCULAR | Status: DC
  Filled 2020-02-24 (×6): qty 2

## 2020-02-24 MED ORDER — TRAZODONE HCL 50 MG PO TABS
50.0000 mg | ORAL_TABLET | Freq: Every evening | ORAL | Status: DC | PRN
  Filled 2020-02-24: qty 1

## 2020-02-24 NOTE — BHH Suicide Risk Assessment (Signed)
[facility] Admission Suicide Risk Assessment   Nursing information obtained from:  Patient Demographic factors:  Male, Low socioeconomic status, Living alone Current Mental Status:  NA Loss Factors:  NA Historical Factors:  NA Risk Reduction Factors:  NA  Total Time spent with patient: 45 minutes Principal Problem: Bipolar I disorder (HCC) Diagnosis:  Principal Problem:   Bipolar I disorder (HCC) Active Problems:   Bipolar 1 disorder (HCC)   Psychosis (HCC)   Schizophrenia (HCC)  Subjective Data: Admission needed due to severity of psychosis  Continued Clinical Symptoms:  Alcohol Use Disorder Identification Test Final Score (AUDIT): 0 The "Alcohol Use Disorders Identification Test", Guidelines for Use in Primary Care, Second Edition.  World Science writer [place]). Score between 0-7:  no or low risk or alcohol related problems. Score between 8-15:  moderate risk of alcohol related problems. Score between 16-19:  high risk of alcohol related problems. Score 20 or above:  warrants further diagnostic evaluation for alcohol dependence and treatment.   CLINICAL FACTORS:   Schizophrenia:   Paranoid or undifferentiated type  Musculoskeletal: Strength & Muscle Tone: within normal limits Gait & Station: normal Patient leans: N/A  Psychiatric Specialty Exam: Physical Exam  Review of Systems  Blood pressure 122/69, pulse 76, temperature 98.6 F (37 C), temperature source Oral, resp. rate 16, SpO2 100 %.There is no height or weight on file to calculate BMI.  General Appearance: Casual  Eye Contact:  Minimal  Speech:  Normal Rate  Volume:  Decreased  Mood:  Anxious and Euthymic  Affect:  Blunt  Thought Process:  Irrelevant and Descriptions of Associations: Circumstantial  Orientation:  Full (Time, Place, and Person)  Thought Content:  Illogical and Delusions  Suicidal Thoughts:  No  Homicidal Thoughts:  No  Memory:  Immediate;   Poor Recent;   Poor Remote;   Poor  Judgement:   Poor  Insight:  Lacking  Psychomotor Activity:  Normal  Concentration:  Concentration: Poor and Attention Span: Poor  Recall:  Poor  Fund of Knowledge:  Poor  Language:  Poor  Akathisia:  Negative  Handed:  Right  AIMS (if indicated):     Assets:  Resilience Social Support  ADL's:  Intact  Cognition:  WNL  Sleep:        COGNITIVE FEATURES THAT CONTRIBUTE TO RISK:  Loss of executive function    SUICIDE RISK:   Minimal: No identifiable suicidal ideation.  Patients presenting with no risk factors but with morbid ruminations; may be classified as minimal risk based on the severity of the depressive symptoms  PLAN OF CARE: Admit for stabilization  I certify that inpatient services furnished can reasonably be expected to improve the patient's condition.   Malvin Johns, MD 02/24/2020, 2:05 PM

## 2020-02-24 NOTE — Tx Team (Signed)
Initial Treatment Plan 02/24/2020 4:54 PM Jeffrey Michael TCY:[id number]    PATIENT STRESSORS: Medication change or noncompliance Substance abuse   PATIENT STRENGTHS: Communication skills Work skills   PATIENT IDENTIFIED PROBLEMS: Medication non-compliance  depresssion  housing                 DISCHARGE CRITERIA:  Improved stabilization in mood, thinking, and/or behavior Verbal commitment to aftercare and medication compliance  PRELIMINARY DISCHARGE PLAN: Outpatient therapy Return to previous living arrangement  PATIENT/FAMILY INVOLVEMENT: This treatment plan has been presented to and reviewed with the patient, Jeffrey Michael, and/or family member, .  The patient and family have been given the opportunity to ask questions and make suggestions.  Wardell Heath, RN 02/24/2020, 4:54 PM

## 2020-02-24 NOTE — Progress Notes (Signed)
BHH Observation Crisis Plan  Reason for Crisis Plan:  Crisis Stabilization   Plan of Care:  Referral for Inpatient Hospitalization and Referral for Telepsychiatry/Psychiatric Consult  Family Support:      Current Living Environment:  Living Arrangements: Other (Comment)(congregate care setting)  Insurance:   Hospital Account    Name Acct ID Class Status Primary Coverage   Jeffrey Michael, Jeffrey Michael [id number] BEHAVIORAL HEALTH OBSERVATION Open UNITED HEALTHCARE MEDICARE - UNITEDHEALTHCARE DUAL COMPLETE        Guarantor Account (for Hospital Account [id number])    Name Relation to Pt Service Area Active? Acct Type   Jeffrey Michael Self [facility] Yes Behavioral Health   Address Phone       [address] [place], [place] [address] [phone](H)          Coverage Information (for Hospital Account [id number])    F/O Payor/Plan Precert #   [facility] MEDICARE/UNITEDHEALTHCARE DUAL COMPLETE    Subscriber Subscriber #   Jeffrey Michael [id number]   Address Phone   [address] [place], [place] [address] [phone]      Legal Guardian:     Primary Care Provider:  System, Provider Not In  Current Outpatient Providers:  N/A  Psychiatrist:  Name of Psychiatrist: None  Counselor/Therapist:  Name of Therapist: None  Compliant with Medications:  No  Additional Information:   Jeffrey Michael 3/25/202112:10 AM

## 2020-02-24 NOTE — Progress Notes (Signed)
   02/24/20 2300  Psych Admission Type (Psych Patients Only)  Admission Status Involuntary  Psychosocial Assessment  Patient Complaints Anxiety  Eye Contact Avoids  Facial Expression Angry  Affect Inconsistent with thought content  Speech Pressured  Interaction Avoidant  Motor Activity Other (Comment) (in bed)  Appearance/Hygiene Unremarkable  Behavior Characteristics Unwilling to participate  Mood Anxious  Thought Process  Coherency WDL  Content WDL  Delusions None reported or observed  Perception WDL  Hallucination UTA  Judgment Limited  Confusion Mild  Danger to Self  Current suicidal ideation? Denies  Danger to Others  Danger to Others None reported or observed  D: Patient in room sleeping most of the evening. Pt interaction is minimal responds by nodding his head sometimes.  A: Medications administered as prescribed. Support and encouragement provided as needed.  R: Patient remains safe on the unit. Will continue to monitor for safety and stability.
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
## 2020-02-24 NOTE — Progress Notes (Signed)
D: Patient Presents with depressed mood and affect.  Patient was annoyed during med pass and refused his medicine without incident saying that he was "allergic" to haldol and zoloft.  Patient isolated in his room.Pt. denies SI/ HI and AVH.  Marland KitchenA:  Patient refused scheduled medicine.  Support and encouragement provided. Routine safety checks conducted every 15 minutes. Patient  Informed to notify staff with any concerns.  Safety maintained  R:  No adverse drug reactions noted.  Patient contracts for safety.  Patient not compliant with medication plan. Safety maintained.

## 2020-02-24 NOTE — BH Assessment (Signed)
BHH Assessment Progress Note  Per [name], MD, this pt requires psychiatric hospitalization.  Jeffrey Michael has assigned pt to [facility] Rm 501-1.  Pt presents under IVC initiated by pt's housing Interior and spatial designer, and upheld by Dr [name].  IVC documents may be found on pt's chart.  Pt's nurse, [name], has been notified.   Doylene Canning, [place] Behavioral Health Coordinator [phone]
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
## 2020-02-24 NOTE — Progress Notes (Signed)
Psychoeducational Group Note  Date:  02/24/2020 Time:  2028  Group Topic/Focus:  Wrap-Up Group:   The focus of this group is to help patients review their daily goal of treatment and discuss progress on daily workbooks.  Participation Level: Did Not Attend  Participation Quality:  Not Applicable  Affect:  Not Applicable  Cognitive:  Not Applicable  Insight:  Not Applicable  Engagement in Group: Not Applicable  Additional Comments: The patient did not attend group this evening.   Hazle Coca S 02/24/2020, 8:28 PM
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
## 2020-02-25 NOTE — Tx Team (Signed)
Interdisciplinary Treatment and Diagnostic Plan Update  02/25/2020 Time of Session: 900am Jeffrey Michael MRN: [id number]  Principal Diagnosis: Bipolar I disorder ([place])  Secondary Diagnoses: Principal Problem:   Bipolar I disorder ([place]) Active Problems:   Bipolar 1 disorder ([place])   Psychosis ([place])   Schizophrenia ([place])   Current Medications:  Current Facility-Administered Medications  Medication Dose Route Frequency Provider Last Rate Last Admin  . acetaminophen (TYLENOL) tablet 650 mg  650 mg Oral Q6H PRN Anike, Adaku C, NP      . alum & mag hydroxide-simeth (MAALOX/MYLANTA) 200-200-20 MG/5ML suspension 30 mL  30 mL Oral Q4H PRN Sharma Covert, MD      . haloperidol (HALDOL) tablet 5 mg  5 mg Oral BID Sharma Covert, MD       Or  . haloperidol lactate (HALDOL) injection 10 mg  10 mg Intramuscular BID Sharma Covert, MD      . hydrOXYzine (ATARAX/VISTARIL) tablet 25 mg  25 mg Oral TID PRN Sharma Covert, MD   25 mg at 02/24/20 1745  . OLANZapine zydis (ZYPREXA) disintegrating tablet 10 mg  10 mg Oral Q8H PRN Sharma Covert, MD       And  . LORazepam (ATIVAN) tablet 1 mg  1 mg Oral PRN Sharma Covert, MD       And  . ziprasidone (GEODON) injection 20 mg  20 mg Intramuscular PRN Sharma Covert, MD      . magnesium hydroxide (MILK OF MAGNESIA) suspension 30 mL  30 mL Oral Daily PRN Sharma Covert, MD      . OLANZapine zydis (ZYPREXA) disintegrating tablet 10 mg  10 mg Oral Daily Johnn Hai, MD   10 mg at 02/24/20 1744  . OLANZapine zydis (ZYPREXA) disintegrating tablet 15 mg  15 mg Oral QHS Johnn Hai, MD   15 mg at 02/24/20 2158  . sertraline (ZOLOFT) tablet 50 mg  50 mg Oral Daily Sharma Covert, MD      . traZODone (DESYREL) tablet 50 mg  50 mg Oral QHS PRN Sharma Covert, MD       PTA Medications: Medications Prior to Admission  Medication Sig Dispense Refill Last Dose  . ibuprofen (ADVIL,MOTRIN) 600 MG tablet Take 1 tablet (600 mg  total) by mouth every 6 (six) hours as needed. 30 tablet 0   . haloperidol (HALDOL) 10 MG tablet Take 10 mg by mouth every evening.   Not Taking at Unknown time  . haloperidol decanoate (HALDOL DECANOATE) 100 MG/ML injection Inject into the muscle every 28 (twenty-eight) days.   Not Taking at Unknown time  . HYDROcodone-acetaminophen (NORCO/VICODIN) 5-325 MG per tablet Take 1 tablet by mouth every 4 (four) hours as needed for moderate pain or severe pain. (Patient not taking: Reported on 02/24/2020) 18 tablet 0 Completed Course at Unknown time  . sertraline (ZOLOFT) 100 MG tablet Take 100 mg by mouth every evening.    Not Taking at Unknown time  . traZODone (DESYREL) 100 MG tablet Take 100 mg by mouth at bedtime.   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   Physician Treatment Plan for Primary Diagnosis: Bipolar I disorder ([place]) Long Term Goal(s): Improvement in symptoms so as ready for discharge Improvement in symptoms so as ready  for discharge   Short Term Goals: Ability to identify changes in lifestyle to reduce recurrence of condition will improve Ability to verbalize feelings will improve Ability to identify changes in lifestyle to reduce recurrence of condition will improve Ability to verbalize feelings will improve Ability to disclose and discuss suicidal ideas Ability to identify and develop effective coping behaviors will improve  Medication Management: Evaluate patient's response, side effects, and tolerance of medication regimen.  Therapeutic Interventions: 1 to 1 sessions, Unit Group sessions and Medication administration.  Evaluation of Outcomes: Not Met  Physician Treatment Plan for Secondary Diagnosis: Principal  Problem:   Bipolar I disorder ([place]) Active Problems:   Bipolar 1 disorder ([place])   Psychosis ([place])   Schizophrenia ([place])  Long Term Goal(s): Improvement in symptoms so as ready for discharge Improvement in symptoms so as ready for discharge   Short Term Goals: Ability to identify changes in lifestyle to reduce recurrence of condition will improve Ability to verbalize feelings will improve Ability to identify changes in lifestyle to reduce recurrence of condition will improve Ability to verbalize feelings will improve Ability to disclose and discuss suicidal ideas Ability to identify and develop effective coping behaviors will improve     Medication Management: Evaluate patient's response, side effects, and tolerance of medication regimen.  Therapeutic Interventions: 1 to 1 sessions, Unit Group sessions and Medication administration.  Evaluation of Outcomes: Not Met   RN Treatment Plan for Primary Diagnosis: Bipolar I disorder ([place]) Long Term Goal(s): Knowledge of disease and therapeutic regimen to maintain health will improve  Short Term Goals: Ability to verbalize frustration and anger appropriately will improve, Ability to demonstrate self-control, Ability to identify and develop effective coping behaviors will improve and Compliance with prescribed medications will improve  Medication Management: RN will administer medications as ordered by provider, will assess and evaluate patient's response and provide education to patient for prescribed medication. RN will report any adverse and/or side effects to prescribing provider.  Therapeutic Interventions: 1 on 1 counseling sessions, Psychoeducation, Medication administration, Evaluate responses to treatment, Monitor vital signs and CBGs as ordered, Perform/monitor CIWA, COWS, AIMS and Fall Risk screenings as ordered, Perform wound care treatments as ordered.  Evaluation of Outcomes: Not Met   LCSW Treatment Plan for Primary  Diagnosis: Bipolar I disorder ([place]) Long Term Goal(s): Safe transition to appropriate next level of care at discharge, Engage patient in therapeutic group addressing interpersonal concerns.  Short Term Goals: Engage patient in aftercare planning with referrals and resources and Increase skills for wellness and recovery  Therapeutic Interventions: Assess for all discharge needs, 1 to 1 time with Social worker, Explore available resources and support systems, Assess for adequacy in community support network, Educate family and significant other(s) on suicide prevention, Complete Psychosocial Assessment, Interpersonal group therapy.  Evaluation of Outcomes: Not Met   Progress in Treatment: Attending groups: No. Participating in groups: No. Taking medication as prescribed: Yes. Toleration medication: Yes. Family/Significant other contact made: No, will contact:  when consent is given Patient understands diagnosis: Yes. Discussing patient identified problems/goals with staff: Yes. Medical problems stabilized or resolved: Yes. Denies suicidal/homicidal ideation: Yes. Issues/concerns per patient self-inventory: No. Other: N/A  New problem(s) identified: No, Describe:  none  New Short Term/Long Term Goal(s): Detox, elimination of AVH/symptoms of psychosis, medication management for mood stabilization; elimination of SI thoughts; development of comprehensive mental wellness/sobriety plan.   Patient Goals:  "change my medicines"  Discharge Plan or Barriers: SPE pamphlet, Mobile Crisis information, and AA/NA information provided  to patient for additional community support and resources. Pt is agreeable to referral to [facility].  Reason for Continuation of Hospitalization: Hallucinations Medication stabilization  Estimated Length of Stay: 3-5 days  Attendees: Patient: Jeffrey Michael 02/25/2020 11:49 AM  Physician: Dr Parke Poisson MD 02/25/2020 11:49 AM  Nursing:  02/25/2020 11:49 AM  RN Care Manager:  02/25/2020 11:49 AM  Social Worker: Minette Brine Joaovictor Krone LCSW 02/25/2020 11:49 AM  Recreational Therapist:  02/25/2020 11:49 AM  Other:  02/25/2020 11:49 AM  Other:  02/25/2020 11:49 AM  Other: 02/25/2020 11:49 AM    Scribe for Treatment Team: Mariann Laster Elinda Bunten, LCSW 02/25/2020 11:49 AM

## 2020-02-25 NOTE — Progress Notes (Signed)
   02/25/20 2200  Psych Admission Type (Psych Patients Only)  Admission Status Voluntary  Psychosocial Assessment  Patient Complaints Anxiety  Eye Contact Suspiciousness;Watchful  Facial Expression Anxious;Worried  Affect Inconsistent with thought content;Preoccupied  Scientist, research (medical) Avoidant  Motor Activity Fidgety  Appearance/Hygiene Unremarkable  Behavior Characteristics Cooperative  Mood Labile;Anxious  Thought Process  Coherency WDL  Content Preoccupation  Delusions None reported or observed  Perception WDL  Hallucination Auditory  Judgment Limited  Confusion Mild  Danger to Self  Current suicidal ideation? Denies  Danger to Others  Danger to Others None reported or observed   Pt continues to be paranoid

## 2020-02-25 NOTE — Progress Notes (Signed)
Adult Psychoeducational Group Note  Date:  02/25/2020 Time:  10:50 PM  Group Topic/Focus:  Wrap-Up Group:   The focus of this group is to help patients review their daily goal of treatment and discuss progress on daily workbooks.  Participation Level:  Minimal  Participation Quality:  Appropriate  Affect:  Appropriate  Cognitive:  Confused  Insight: Lacking  Engagement in Group:  Limited  Modes of Intervention:  Discussion  Additional Comments:  Pt stated his goal for today was to focus on his treatment plan. Pt stated he accomplished his goal today. Pt stated his relationship with his family has improvied since he was admitted. Pt stated he felt better about himself today. Pt rated his overall day a  10. Pt stated his appetite was pretty good today. Pt stated his sleep last night was pretty good. Pt stated he was in no physical pain today.   Pt deny auditory or visual hallucinations. Pt denies thoughts of harming himself or others. Pt stated he would alert staff if anything changes.  Felipa Furnace 02/25/2020, 10:50 PM

## 2020-02-25 NOTE — [facility] Suicide Risk Assessment (Signed)
[facility] INPATIENT:  Family/Significant Other Suicide Prevention Education  Suicide Prevention Education:  Patient Refusal for Family/Significant Other Suicide Prevention Education: The patient Jeffrey Michael has refused to provide written consent for family/significant other to be provided Family/Significant Other Suicide Prevention Education during admission and/or prior to discharge.  Physician notified.  SPE completed with pt, as pt refused to consent to family contact. SPI pamphlet provided to pt and pt was encouraged to share information with support network, ask questions, and talk about any concerns relating to SPE. Pt denies access to guns/firearms and verbalized understanding of information provided. Mobile Crisis information also provided to pt.    Charlann Lange Ravin Bendall MSW LCSW 02/25/2020, 1:17 PM

## 2020-02-25 NOTE — [facility] Counselor (Signed)
Adult Comprehensive Assessment  Patient ID: Jeffrey Michael, male   DOB: May 07, 1986, 34 y.o.   MRN: [id number]  Information Source: Information source: Patient  Current Stressors:  Patient states their primary concerns and needs for treatment are:: "to get my meds changed" Patient states their goals for this hospitilization and ongoing recovery are:: "change my meds" Educational / Learning stressors: GED Employment / Job issues: unemployed Family Relationships: pt reports good familial relationships Housing / Lack of housing: Pt reports he lives in [place] [employer] Physical health (include injuries & life threatening diseases): none reported Substance abuse: Pt denies  Living/Environment/Situation:  Living Arrangements: Other (Comment)([facility]) Living conditions (as described by patient or guardian): "good" Who else lives in the home?: other residents, it is a congregate care facility How long has patient lived in current situation?: "3 months"  Family History:  Marital status: Single Does patient have children?: Yes How many children?: 1 How is patient's relationship with their children?: "not good"  Childhood History:  By whom was/is the patient raised?: Both parents Description of patient's relationship with caregiver when they were a child: "straight" Patient's description of current relationship with people who raised him/her: "half-way" Does patient have siblings?: Yes Number of Siblings: 3 Description of patient's current relationship with siblings: "good" Did patient suffer any verbal/emotional/physical/sexual abuse as a child?: Yes(Pt reports he experienced trauma throughout his life but would not elaborate what kind) Did patient suffer from severe childhood neglect?: No Has patient ever been sexually abused/assaulted/raped as an adolescent or adult?: No Was the patient ever a victim of a crime or a disaster?: No Witnessed domestic violence?:  No Has patient been effected by domestic violence as an adult?: No  Education:  Highest grade of school patient has completed: GED Currently a Consulting civil engineer?: No Learning disability?: No  Employment/Work Situation:   Employment situation: Unemployed What is the longest time patient has a held a job?: "A couple years" 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 You Receive Any Psychiatric Treatment/Services While in Equities trader?: No Are There Guns or Other Weapons in Your Home?: No Are These Comptroller?: (N/A)  Financial Resources:   Financial resources: Sales executive, Medicare Does patient have a Lawyer or guardian?: No  Alcohol/Substance Abuse:   What has been your use of drugs/alcohol within the last 12 months?: Pt denies If attempted suicide, did drugs/alcohol play a role in this?: No Alcohol/Substance Abuse Treatment Hx: Denies past history Has alcohol/substance abuse ever caused legal problems?: No  Social Support System:   Forensic psychologist System: (Pt reports "not bad") Type of faith/religion: N/A  Leisure/Recreation:   Leisure and Hobbies: "none"  Strengths/Needs:   What is the patient's perception of their strengths?: Pt reports "reading" Patient states they can use these personal strengths during their treatment to contribute to their recovery: Pt did not respond Patient states these barriers may affect/interfere with their treatment: pt denies Patient states these barriers may affect their return to the community: none reported  Discharge Plan:   Currently receiving community mental health services: No Patient states concerns and preferences for aftercare planning are: Pt is agreeable to referral to [facility] Patient states they will know when they are safe and ready for discharge when: "I don't know" Does patient have access to transportation?: Yes Does patient have financial barriers related to discharge  medications?: No Will patient be returning to same living situation after discharge?: Yes  Summary/Recommendations:   Summary and Recommendations (  to be completed by the evaluator): Pt is a 34 yo male living in [place], [place] ([place] in a concregate care facility. Pt presents to the hospital seeking treatment for medication stabilization, verbal and phsyical agression. Pt has a diagnosis of Bipolar I disorder. Pt is single, unemployed, has one child, reports history of trauma, and reports a decent supoprt system. Pt denies SI/HI/AVH currently. Pt is agreeable to referral to [facility] for outpatient mental health treatment. Recommendations for pt include: crisis stabilization, therapeutic milieu, encourage group attendance and participation, medication management for mood stabilization, and development for comprehensive mental wellness plan. CSW assessing for appropriate referrals.  [place] MSW LCSW 02/25/2020 1:17 PM

## 2020-02-25 NOTE — [facility] Group Notes (Signed)
LCSW Group Therapy Note  02/25/2020 10:28 AM  Type of Therapy and Topic:  Group Therapy:  Feelings around Relapse and Recovery  Participation Level:  Did Not Attend   Description of Group:    Patients in this group will discuss emotions they experience before and after a relapse. They will process how experiencing these feelings, or avoidance of experiencing them, relates to having a relapse. Facilitator will guide patients to explore emotions they have related to recovery. Patients will be encouraged to process which emotions are more powerful. They will be guided to discuss the emotional reaction significant others in their lives may have to their relapse or recovery. Patients will be assisted in exploring ways to respond to the emotions of others without this contributing to a relapse.  Therapeutic Goals: 1. Patient will identify two or more emotions that lead to a relapse for them 2. Patient will identify two emotions that result when they relapse 3. Patient will identify two emotions related to recovery 4. Patient will demonstrate ability to communicate their needs through discussion and/or role plays   Summary of Patient Progress: x   Therapeutic Modalities:   Cognitive Behavioral Therapy Solution-Focused Therapy Assertiveness Training Relapse Prevention Therapy   Iris Pert, MSW, LCSW Clinical Social Work 02/25/2020 10:28 AM
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
## 2020-02-25 NOTE — Progress Notes (Deleted)
   02/25/20 1600  Psych Admission Type (Psych Patients Only)  Admission Status Voluntary  Psychosocial Assessment  Patient Complaints Anxiety  Eye Contact Suspiciousness;Watchful  Facial Expression Anxious;Worried  Affect Inconsistent with thought content;Preoccupied  Speech Pressured  Interaction Avoidant  Motor Activity Fidgety  Appearance/Hygiene Unremarkable  Behavior Characteristics Cooperative  Mood Anxious;Labile;Suspicious  Thought Process  Coherency WDL  Content Preoccupation  Delusions None reported or observed  Perception WDL  Hallucination Auditory  Judgment Limited  Confusion Mild  Danger to Self  Current suicidal ideation? Denies  Danger to Others  Danger to Others None reported or observed   

## 2020-02-25 NOTE — Progress Notes (Signed)
   02/25/20 1600  Psych Admission Type (Psych Patients Only)  Admission Status Voluntary  Psychosocial Assessment  Patient Complaints Anxiety  Eye Contact Suspiciousness;Watchful  Facial Expression Anxious;Worried  Affect Inconsistent with thought content;Preoccupied  Scientist, research (medical) Avoidant  Motor Activity Fidgety  Appearance/Hygiene Unremarkable  Behavior Characteristics Cooperative  Mood Anxious;Labile;Suspicious  Thought Process  Coherency WDL  Content Preoccupation  Delusions None reported or observed  Perception WDL  Hallucination Auditory  Judgment Limited  Confusion Mild  Danger to Self  Current suicidal ideation? Denies  Danger to Others  Danger to Others None reported or observed

## 2020-02-26 LAB — PROLACTIN: Prolactin: 13.4 ng/mL (ref 4.0–15.2)

## 2020-02-26 LAB — GLUCOSE, CAPILLARY: Glucose-Capillary: 110 mg/dL — ABNORMAL HIGH (ref 70–99)

## 2020-02-26 NOTE — Progress Notes (Signed)
   02/26/20 0900  Psych Admission Type (Psych Patients Only)  Admission Status Voluntary  Psychosocial Assessment  Patient Complaints None  Eye Contact Suspiciousness  Facial Expression Flat  Affect Appropriate to circumstance  Speech Logical/coherent  Interaction Assertive  Motor Activity Slow  Appearance/Hygiene Disheveled  Behavior Characteristics Appropriate to situation  Mood Anxious  Thought Process  Coherency WDL  Content Preoccupation  Delusions None reported or observed  Perception WDL  Hallucination None reported or observed  Judgment WDL  Confusion None  Danger to Self  Current suicidal ideation? Denies  Danger to Others  Danger to Others None reported or observed

## 2020-02-26 NOTE — [facility] Group Notes (Signed)
  [facility] LCSW Group Therapy Note  Date/Time:  02/26/2020 11:15AM-12:00PM  Type of Therapy and Topic:  Group Therapy:  Feelings About Hospitalization  Participation Level:  Minimal   Description of Group This process group involved patients discussing their feelings related to being hospitalized, as well as the benefits they see to being in the hospital.  These feelings and benefits were itemized.  The group then brainstormed specific ways in which they could seek those same benefits when they discharge and return home.  Therapeutic Goals Patient will identify and describe positive and negative feelings related to hospitalization Patient will verbalize benefits of hospitalization to themselves personally Patients will brainstorm together ways they can obtain similar benefits in the outpatient setting, identify barriers to wellness and possible solutions  Summary of Patient Progress:  The patient expressed his primary feelings about being hospitalized are "good so far" and he is hopeful he will get on the right medicines while here.  He appeared to doze off for the remainder of group and was no longer responsive throughout group.  Therapeutic Modalities Cognitive Behavioral Therapy Motivational Interviewing    Jeffrey Mantle, LCSW 02/26/2020, 9:27 AM

## 2020-02-26 NOTE — Progress Notes (Signed)
[facility]  02/26/2020 10:40 AM Jeffrey Michael  MRN:  [id number] Subjective: Patient is a 34 year old male with a past psychiatric history significant for schizophrenia who was admitted to the behavioral health hospital on 02/24/2020 after noncompliance with medication, disruptive behavior, and threatening to burn down their facility.  Objective: Patient is seen and examined.  Patient is a 34 year old male with the above-stated past psychiatric history who is seen in follow-up.  He is lying in bed on examination today.  He denied any auditory or visual hallucinations.  When I asked him about whether or not he had threatened to burn down the facility was living at he denied that that it occurred.  He denied any side effects to his current medications.  It was noted in the chart from yesterday that he had endorsed auditory visual hallucinations on admission.  He had answered questions in monosyllables.  Was thought to be cooperative.  His main question today is when he will be released.  Review of his laboratories on admission revealed essentially normal electrolytes, normal lipids, a mildly decreased white blood cell count, decreased MCV and MCH.  His hemoglobin A1c was 6.3.  TSH was normal.  Drug screen was negative.  He continues on Zyprexa and sertraline.  His vital signs are stable, he is afebrile.  He slept 8.75 hours last night.  Principal Problem: Bipolar I disorder (HCC) Diagnosis: Principal Problem:   Bipolar I disorder (HCC) Active Problems:   Bipolar 1 disorder (HCC)   Psychosis (HCC)   Schizophrenia (HCC)  Total Time spent with patient: 20 minutes  Past Psychiatric History: See admission H&P  Past Medical History:  Past Medical History:  Diagnosis Date  . Bipolar 1 disorder (HCC)   . Depression   . Schizophrenia (HCC)    History reviewed. No pertinent surgical history. Family History: History reviewed. No pertinent family history. Family Psychiatric  History: See  admission H&P Social History:  Social History   Substance and Sexual Activity  Alcohol Use Yes   Comment: denies to this RN     Social History   Substance and Sexual Activity  Drug Use Yes  . Types: Marijuana    Social History   Socioeconomic History  . Marital status: Single    Spouse name: Not on file  . Number of children: Not on file  . Years of education: Not on file  . Highest education level: Not on file  Occupational History  . Not on file  Tobacco Use  . Smoking status: Current Every Day Smoker    Types: Cigarettes  . Smokeless tobacco: Never Used  Substance and Sexual Activity  . Alcohol use: Yes    Comment: denies to this RN  . Drug use: Yes    Types: Marijuana  . Sexual activity: Not Currently    Birth control/protection: Abstinence  Other Topics Concern  . Not on file  Social History Narrative  . Not on file   Social Determinants of Health   Financial Resource Strain:   . Difficulty of Paying Living Expenses:   Food Insecurity:   . Worried About Programme researcher, broadcasting/film/videounning Out of Food in the Last Year:   . Baristaan Out of Food in the Last Year:   Transportation Needs:   . Freight forwarderLack of Transportation (Medical):   Marland Kitchen. Lack of Transportation (Non-Medical):   Physical Activity:   . Days of Exercise per Week:   . Minutes of Exercise per Session:   Stress:   . Feeling of  Stress :   Social Connections:   . Frequency of Communication with Friends and Family:   . Frequency of Social Gatherings with Friends and Family:   . Attends Religious Services:   . Active Member of Clubs or Organizations:   . 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                    Sleep: Good  Appetite:  Good  Current  Medications: Current Facility-Administered Medications  Medication Dose Route Frequency Provider Last Rate Last Admin  . acetaminophen (TYLENOL) tablet 650 mg  650 mg Oral Q6H PRN Anike, Adaku C, NP      . alum & mag hydroxide-simeth (MAALOX/MYLANTA) 200-200-20 MG/5ML suspension 30 mL  30 mL Oral Q4H PRN Sharma Covert, MD      . hydrOXYzine (ATARAX/VISTARIL) tablet 25 mg  25 mg Oral TID PRN Sharma Covert, MD   25 mg at 02/25/20 2116  . OLANZapine zydis (ZYPREXA) disintegrating tablet 10 mg  10 mg Oral Q8H PRN Sharma Covert, MD       And  . LORazepam (ATIVAN) tablet 1 mg  1 mg Oral PRN Sharma Covert, MD       And  . ziprasidone (GEODON) injection 20 mg  20 mg Intramuscular PRN Sharma Covert, MD      . magnesium hydroxide (MILK OF MAGNESIA) suspension 30 mL  30 mL Oral Daily PRN Sharma Covert, MD      . OLANZapine zydis (ZYPREXA) disintegrating tablet 10 mg  10 mg Oral Daily Johnn Hai, MD   10 mg at 02/26/20 0819  . OLANZapine zydis (ZYPREXA) disintegrating tablet 15 mg  15 mg Oral QHS Johnn Hai, MD   15 mg at 02/25/20 2116  . sertraline (ZOLOFT) tablet 50 mg  50 mg Oral Daily Sharma Covert, MD   50 mg at 02/26/20 0819  . traZODone (DESYREL) tablet 50 mg  50 mg Oral QHS PRN Sharma Covert, MD        Lab Results:  Results for orders placed or performed during the hospital encounter of 02/23/20 (from the past 48 hour(s))  Urine rapid drug screen (hosp performed)not at [facility]     Status: None   Collection Time: 02/24/20  6:21 PM  Result Value Ref Range   Opiates NONE DETECTED NONE DETECTED   Cocaine NONE DETECTED NONE DETECTED   Benzodiazepines NONE DETECTED NONE DETECTED   Amphetamines NONE DETECTED NONE DETECTED   Tetrahydrocannabinol NONE DETECTED NONE DETECTED   Barbiturates NONE DETECTED NONE DETECTED    Comment: (NOTE) DRUG SCREEN FOR MEDICAL PURPOSES ONLY.  IF CONFIRMATION IS NEEDED FOR ANY PURPOSE, NOTIFY LAB WITHIN 5 DAYS. LOWEST  DETECTABLE LIMITS FOR URINE DRUG SCREEN Drug Class                     Cutoff (ng/mL) Amphetamine and metabolites    1000 Barbiturate and metabolites    200 Benzodiazepine                 875 Tricyclics and metabolites     300 Opiates and metabolites        300 Cocaine and  metabolites        300 THC                            50 Performed at [facility], [address] [place], [place] [address]   Urinalysis, Routine w reflex microscopic     Status: None   Collection Time: 02/24/20  6:21 PM  Result Value Ref Range   Color, Urine YELLOW YELLOW   APPearance CLEAR CLEAR   Specific Gravity, Urine 1.024 1.005 - 1.030   pH 5.0 5.0 - 8.0   Glucose, UA NEGATIVE NEGATIVE mg/dL   Hgb urine dipstick NEGATIVE NEGATIVE   Bilirubin Urine NEGATIVE NEGATIVE   Ketones, ur NEGATIVE NEGATIVE mg/dL   Protein, ur NEGATIVE NEGATIVE mg/dL   Nitrite NEGATIVE NEGATIVE   Leukocytes,Ua NEGATIVE NEGATIVE    Comment: Performed at [facility], [address] [place], [place] [address]  CBC     Status: Abnormal   Collection Time: 02/24/20  6:30 PM  Result Value Ref Range   WBC 3.3 (L) 4.0 - 10.5 K/uL   RBC 5.78 4.22 - 5.81 MIL/uL   Hemoglobin 13.4 13.0 - 17.0 g/dL   HCT 14.9 70.2 - 63.7 %   MCV 76.0 (L) 80.0 - 100.0 fL   MCH 23.2 (L) 26.0 - 34.0 pg   MCHC 30.5 30.0 - 36.0 g/dL   RDW 85.8 (H) 85.0 - 27.7 %   Platelets 288 150 - 400 K/uL   nRBC 0.0 0.0 - 0.2 %    Comment: Performed at [facility], [address], [place], [place] [address]  Comprehensive metabolic panel     Status: None   Collection Time: 02/24/20  6:30 PM  Result Value Ref Range   Sodium 138 135 - 145 mmol/L   Potassium 4.3 3.5 - 5.1 mmol/L   Chloride 105 98 - 111 mmol/L   CO2 26 22 - 32 mmol/L   Glucose, Bld 94 70 - 99 mg/dL    Comment: Glucose reference range applies only to samples taken after fasting for at least 8 hours.   BUN 16 6 - 20 mg/dL   Creatinine, Ser  8.67 0.61 - 1.24 mg/dL   Calcium 9.4 8.9 - 67.2 mg/dL   Total Protein 7.2 6.5 - 8.1 g/dL   Albumin 4.3 3.5 - 5.0 g/dL   AST 21 15 - 41 U/L   ALT 25 0 - 44 U/L   Alkaline Phosphatase 80 38 - 126 U/L   Total Bilirubin 0.8 0.3 - 1.2 mg/dL   GFR calc non Af Amer >60 >60 mL/min   GFR calc Af Amer >60 >60 mL/min   Anion gap 7 5 - 15    Comment: Performed at [facility], [address] [place], [place] [address]  Hemoglobin A1c     Status: Abnormal   Collection Time: 02/24/20  6:30 PM  Result Value Ref Range   Hgb A1c MFr Bld 6.3 (H) 4.8 - 5.6 %    Comment: (NOTE) Pre diabetes:          5.7%-6.4% Diabetes:              >6.4% Glycemic control for   <7.0% adults with diabetes    Mean Plasma Glucose 134.11 mg/dL    Comment: Performed at [facility], [address], [place], [place] [address]  Lipid panel     Status: Abnormal  Collection Time: 02/24/20  6:30 PM  Result Value Ref Range   Cholesterol 112 0 - 200 mg/dL   Triglycerides 69 <540 mg/dL   HDL 35 (L) >98 mg/dL   Total CHOL/HDL Ratio 3.2 RATIO   VLDL 14 0 - 40 mg/dL   LDL Cholesterol 63 0 - 99 mg/dL    Comment:        Total Cholesterol/HDL:CHD Risk Coronary Heart Disease Risk Table                     Men   Women  1/2 Average Risk   3.4   3.3  Average Risk       5.0   4.4  2 X Average Risk   9.6   7.1  3 X Average Risk  23.4   11.0        Use the calculated Patient Ratio above and the CHD Risk Table to determine the patient's CHD Risk.        ATP III CLASSIFICATION (LDL):  <100     mg/dL   Optimal  119-147  mg/dL   Near or Above                    Optimal  130-159  mg/dL   Borderline  829-562  mg/dL   High  >130     mg/dL   Very High Performed at [facility], [address], [place], [place] [address]   Prolactin     Status: None   Collection Time: 02/24/20  6:30 PM  Result Value Ref Range   Prolactin 13.4 4.0 - 15.2 ng/mL    Comment: (NOTE) Performed At: [facility] [address] [place], [place] [id number] Jolene Schimke MD [id number]:[id number]   TSH     Status: None   Collection Time: 02/24/20  6:30 PM  Result Value Ref Range   TSH 2.046 0.350 - 4.500 uIU/mL    Comment: Performed by a 3rd Generation assay with a functional sensitivity of <=0.01 uIU/mL. Performed at [facility], [address] [place], [place] [address]     Blood Alcohol level:  Lab Results  Component Value Date   ETH <10 11/18/2019    Metabolic Disorder Labs: Lab Results  Component Value Date   HGBA1C 6.3 (H) 02/24/2020   MPG 134.11 02/24/2020   Lab Results  Component Value Date   PROLACTIN 13.4 02/24/2020   Lab Results  Component Value Date   CHOL 112 02/24/2020   TRIG 69 02/24/2020   HDL 35 (L) 02/24/2020   CHOLHDL 3.2 02/24/2020   VLDL 14 02/24/2020   LDLCALC 63 02/24/2020   LDLCALC 40 07/02/2014    Physical Findings: AIMS: Facial and Oral Movements Muscles of Facial Expression: None, normal Lips and Perioral Area: None, normal Jaw: None, normal Tongue: None, normal,Extremity Movements Upper (arms, wrists, hands, fingers): None, normal Lower (legs, knees, ankles, toes): None, normal, Trunk Movements Neck, shoulders, hips: None, normal, Overall Severity Severity of abnormal movements (highest score from questions above): None, normal Incapacitation due to abnormal movements: None, normal Patient's awareness of abnormal movements (rate only patient's report): No Awareness, Dental Status Current problems with teeth and/or dentures?: No Does patient usually wear dentures?: No  CIWA:  CIWA-Ar Total: 0 COWS:     Musculoskeletal: Strength & Muscle Tone: within normal limits Gait & Station: normal Patient leans: N/A  Psychiatric Specialty Exam: Physical Exam  Nursing note and vitals reviewed. Constitutional:  He appears well-developed and well-nourished.  HENT:  Head: Normocephalic and atraumatic.  Respiratory:  Effort normal.  Neurological: He is alert.    Review of Systems  Blood pressure 127/79, pulse 85, temperature 98.5 F (36.9 C), temperature source Oral, resp. rate 16, SpO2 100 %.There is no height or weight on file to calculate BMI.  General Appearance: Casual  Eye Contact:  Fair  Speech:  Normal Rate  Volume:  Normal  Mood:  Dysphoric  Affect:  Congruent  Thought Process:  Coherent and Descriptions of Associations: Circumstantial  Orientation:  Full (Time, Place, and Person)  Thought Content:  WDL  Suicidal Thoughts:  No  Homicidal Thoughts:  No  Memory:  Immediate;   Fair Recent;   Fair Remote;   Fair  Judgement:  Intact  Insight:  Lacking  Psychomotor Activity:  Normal  Concentration:  Concentration: Fair and Attention Span: Fair  Recall:  [employer] of Knowledge:  Fair  Language:  Fair  Akathisia:  Negative  Handed:  Right  AIMS (if indicated):     Assets:  Desire for Improvement Resilience  ADL's:  Intact  Cognition:  WNL  Sleep:  Number of Hours: 8.75     Treatment Plan Summary: Daily contact with patient to assess and evaluate symptoms and progress in treatment, Medication management and Plan : Patient is seen and examined.  Patient is a 34 year old male with the above-stated past psychiatric history who is seen in follow-up.   Diagnosis: #1 schizophrenia, #2 diabetes mellitus type 2  Patient is seen in follow-up.  He seems to be doing better than on admission.  He denied any auditory or visual hallucinations.  He denied any suicidal or homicidal ideation.  He did denied that he had threatened to harm anyone at the facility.  He is curious as to when he will be discharged.  I told him we have to find out for sure whether or not the facility would be willing to take him back.  We will have social work contact them and assess that.  No change in his psychiatric medications.  His blood sugar on admission was 94, but his hemoglobin A1c is 6.3.  The only notes we really  have in the chart are from ER visits.  It does not appear that he has been diagnosed with diabetes previously.  I will try and get daily blood sugars on him and see if he needs to have oral hypoglycemics started.  Otherwise no changes medications today.  1.  Continue hydroxyzine 25 mg p.o. 3 times daily as needed anxiety. 2.  Continue agitation protocol as needed. 3.  Continue Zyprexa Zydis 10 mg p.o. daily and 15 mg p.o. nightly for psychosis. 4.  Continue sertraline 50 mg p.o. daily for anxiety and depression. 5.  Continue trazodone 50 mg p.o. nightly as needed insomnia. 6.  Obtain daily blood sugars to assess diabetic control. 7.  Disposition planning-in progress.  Antonieta Pert, MD 02/26/2020, 10:40 AM

## 2020-02-26 NOTE — Progress Notes (Signed)
   02/26/20 2005  COVID-19 Daily Checkoff  Have you had a fever (temp > 37.80C/100F)  in the past 24 hours?  No  If you have had runny nose, nasal congestion, sneezing in the past 24 hours, has it worsened? No  COVID-19 EXPOSURE  Have you traveled outside the state in the past 14 days? No  Have you been in contact with someone with a confirmed diagnosis of COVID-19 or PUI in the past 14 days without wearing appropriate PPE? No  Have you been living in the same home as a person with confirmed diagnosis of COVID-19 or a PUI (household contact)? No  Have you been diagnosed with COVID-19? No

## 2020-02-27 LAB — GLUCOSE, CAPILLARY: Glucose-Capillary: 94 mg/dL (ref 70–99)

## 2020-02-27 NOTE — Progress Notes (Signed)
   02/27/20 2115  COVID-19 Daily Checkoff  Have you had a fever (temp > 37.80C/100F)  in the past 24 hours?  No  If you have had runny nose, nasal congestion, sneezing in the past 24 hours, has it worsened? No  COVID-19 EXPOSURE  Have you traveled outside the state in the past 14 days? No  Have you been in contact with someone with a confirmed diagnosis of COVID-19 or PUI in the past 14 days without wearing appropriate PPE? No  Have you been living in the same home as a person with confirmed diagnosis of COVID-19 or a PUI (household contact)? No  Have you been diagnosed with COVID-19? No

## 2020-02-27 NOTE — Progress Notes (Signed)
[facility] MD Progress Note  02/27/2020 10:36 AM MILFRED KRAMMES  MRN:  [id number] Subjective:  Patient is a 34 year old male with a past psychiatric history significant for schizophrenia who was admitted to the behavioral health hospital on 02/24/2020 after noncompliance with medication, disruptive behavior, and threatening to burn down their facility.  Objective: Patient is seen and examined.  Patient is a 34 year old male with the above-stated past psychiatric history is seen in follow-up.  He is essentially unchanged from yesterday.  He denied any auditory or visual hallucinations.  He denied any suicidal or homicidal ideation.  We discussed his discharge planning.  He stated that he was told that he can return to the facility that he was had.  I asked him to contact them, and will have social work contact them to see if they are willing to take him back given what led to his admission in the first place.  He denied any side effects to his current medications.  He slept 6.75 hours last night.  Vital signs are stable, he is afebrile.  No new laboratories.  Principal Problem: Bipolar I disorder (HCC) Diagnosis: Principal Problem:   Bipolar I disorder (HCC) Active Problems:   Bipolar 1 disorder (HCC)   Psychosis (HCC)   Schizophrenia (HCC)  Total Time spent with patient: 15 minutes  Past Psychiatric History: Admission H&P  Past Medical History:  Past Medical History:  Diagnosis Date  . Bipolar 1 disorder (HCC)   . Depression   . Schizophrenia (HCC)    History reviewed. No pertinent surgical history. Family History: History reviewed. No pertinent family history. Family Psychiatric  History: See admission H&P Social History:  Social History   Substance and Sexual Activity  Alcohol Use Yes   Comment: denies to this RN     Social History   Substance and Sexual Activity  Drug Use Yes  . Types: Marijuana    Social History   Socioeconomic History  . Marital status: Single    Spouse  name: Not on file  . Number of children: Not on file  . Years of education: Not on file  . Highest education level: Not on file  Occupational History  . Not on file  Tobacco Use  . Smoking status: Current Every Day Smoker    Types: Cigarettes  . Smokeless tobacco: Never Used  Substance and Sexual Activity  . Alcohol use: Yes    Comment: denies to this RN  . Drug use: Yes    Types: Marijuana  . Sexual activity: Not Currently    Birth control/protection: Abstinence  Other Topics Concern  . Not on file  Social History Narrative  . Not on file   Social Determinants of Health   Financial Resource Strain:   . Difficulty of Paying Living Expenses:   Food Insecurity:   . Worried About Programme researcher, broadcasting/film/video in the Last Year:   . Barista in the Last Year:   Transportation Needs:   . Freight forwarder (Medical):   Marland Kitchen Lack of Transportation (Non-Medical):   Physical Activity:   . Days of Exercise per Week:   . Minutes of Exercise per Session:   Stress:   . Feeling of Stress :   Social Connections:   . Frequency of Communication with Friends and Family:   . Frequency of Social Gatherings with Friends and Family:   . Attends Religious Services:   . Active Member of Clubs or Organizations:   . 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                    Sleep: Good  Appetite:  Good  Current Medications: Current Facility-Administered Medications  Medication Dose Route Frequency Provider Last Rate Last Admin  . acetaminophen (TYLENOL) tablet 650 mg  650 mg Oral Q6H PRN Anike, Adaku C, NP      . alum & mag hydroxide-simeth (MAALOX/MYLANTA) 200-200-20 MG/5ML suspension 30 mL  30 mL Oral  Q4H PRN Antonieta Pert, MD      . hydrOXYzine (ATARAX/VISTARIL) tablet 25 mg  25 mg Oral TID PRN Antonieta Pert, MD   25 mg at 02/25/20 2116  . OLANZapine zydis (ZYPREXA) disintegrating tablet 10 mg  10 mg Oral Q8H PRN Antonieta Pert, MD       And  . LORazepam (ATIVAN) tablet 1 mg  1 mg Oral PRN Antonieta Pert, MD       And  . ziprasidone (GEODON) injection 20 mg  20 mg Intramuscular PRN Antonieta Pert, MD      . magnesium hydroxide (MILK OF MAGNESIA) suspension 30 mL  30 mL Oral Daily PRN Antonieta Pert, MD      . OLANZapine zydis (ZYPREXA) disintegrating tablet 10 mg  10 mg Oral Daily Malvin Johns, MD   10 mg at 02/27/20 0747  . OLANZapine zydis (ZYPREXA) disintegrating tablet 15 mg  15 mg Oral QHS Malvin Johns, MD   15 mg at 02/26/20 2137  . sertraline (ZOLOFT) tablet 50 mg  50 mg Oral Daily Antonieta Pert, MD   50 mg at 02/27/20 0747  . traZODone (DESYREL) tablet 50 mg  50 mg Oral QHS PRN Antonieta Pert, MD        Lab Results:  Results for orders placed or performed during the hospital encounter of 02/23/20 (from the past 48 hour(s))  Glucose, capillary     Status: Abnormal   Collection Time: 02/26/20 12:10 PM  Result Value Ref Range   Glucose-Capillary 110 (H) 70 - 99 mg/dL    Comment: Glucose reference range applies only to samples taken after fasting for at least 8 hours.  Glucose, capillary     Status: None   Collection Time: 02/27/20  6:07 AM  Result Value Ref Range   Glucose-Capillary 94 70 - 99 mg/dL    Comment: Glucose reference range applies only to samples taken after fasting for at least 8 hours.    Blood Alcohol level:  Lab Results  Component Value Date   ETH <10 11/18/2019    Metabolic Disorder Labs: Lab Results  Component Value Date   HGBA1C 6.3 (H) 02/24/2020   MPG 134.11 02/24/2020   Lab Results  Component Value Date   PROLACTIN 13.4 02/24/2020   Lab Results  Component Value Date   CHOL 112 02/24/2020   TRIG 69  02/24/2020   HDL 35 (L) 02/24/2020   CHOLHDL 3.2 02/24/2020   VLDL 14 02/24/2020   LDLCALC 63 02/24/2020   LDLCALC 40 07/02/2014    Physical Findings: AIMS: Facial and Oral Movements Muscles of Facial Expression: None, normal Lips and Perioral Area: None, normal Jaw: None, normal Tongue: None, normal,Extremity Movements Upper (  arms, wrists, hands, fingers): None, normal Lower (legs, knees, ankles, toes): None, normal, Trunk Movements Neck, shoulders, hips: None, normal, Overall Severity Severity of abnormal movements (highest score from questions above): None, normal Incapacitation due to abnormal movements: None, normal Patient's awareness of abnormal movements (rate only patient's report): No Awareness, Dental Status Current problems with teeth and/or dentures?: No Does patient usually wear dentures?: No  CIWA:  CIWA-Ar Total: 0 COWS:     Musculoskeletal: Strength & Muscle Tone: within normal limits Gait & Station: normal Patient leans: N/A  Psychiatric Specialty Exam: Physical Exam  Nursing note and vitals reviewed. Constitutional: He is oriented to person, place, and time. He appears well-developed and well-nourished.  HENT:  Head: Normocephalic and atraumatic.  Respiratory: Effort normal.  Neurological: He is alert and oriented to person, place, and time.    Review of Systems  Blood pressure 120/86, pulse 87, temperature 97.8 F (36.6 C), temperature source Oral, resp. rate 16, SpO2 100 %.There is no height or weight on file to calculate BMI.  General Appearance: Casual  Eye Contact:  Fair  Speech:  Normal Rate  Volume:  Normal  Mood:  Euthymic  Affect:  Congruent  Thought Process:  Coherent and Descriptions of Associations: Circumstantial  Orientation:  Full (Time, Place, and Person)  Thought Content:  Logical  Suicidal Thoughts:  No  Homicidal Thoughts:  No  Memory:  Immediate;   Fair Recent;   Fair Remote;   Fair  Judgement:  Intact  Insight:  Fair   Psychomotor Activity:  Decreased  Concentration:  Concentration: Fair and Attention Span: Fair  Recall:  [employer] of Knowledge:  Fair  Language:  Fair  Akathisia:  Negative  Handed:  Right  AIMS (if indicated):     Assets:  Desire for Improvement Resilience  ADL's:  Intact  Cognition:  WNL  Sleep:  Number of Hours: 6.75     Treatment Plan Summary: Daily contact with patient to assess and evaluate symptoms and progress in treatment, Medication management and Plan : Patient is seen and examined.  Patient is a 34 year old male with the above-stated past psychiatric history who is seen in follow-up.   Diagnosis: #1 schizophrenia #2 type 2 diabetes mellitus; diet controlled  Patient is seen in follow-up.  He is essentially unchanged from yesterday.  He denied all symptoms.  He denied any suicidal or homicidal ideation.  He tells me this morning that he felt as though the facility was going to take him back.  I told him I want to make sure that given the circumstances that led to his admission.  He denied any side effects to his current medications.  His blood sugar this morning was 94.  Suspect that his hemoglobin A1c at 6.3 may be an error.  That will have to be rechecked as an outpatient.  Otherwise if he continues to improve anticipate discharge in 1 to 2 days.   1.  Continue hydroxyzine 25 mg p.o. 3 times daily as needed anxiety. 2.  Continue agitation protocol as needed. 3.  Continue Zyprexa Zydis 10 mg p.o. daily and 15 mg p.o. nightly for psychosis. 4.  Continue sertraline 50 mg p.o. daily for anxiety and depression. 5.  Continue trazodone 50 mg p.o. nightly as needed insomnia. 6.    Continue daily blood sugars to assess diabetic control. 7.  Disposition planning-in progress.  Sharma Covert, MD 02/27/2020, 10:36 AM

## 2020-02-27 NOTE — Progress Notes (Signed)
Adult Psychoeducational Group Note  Date:  02/27/2020 Time:  12:58 AM  Group Topic/Focus:  Wrap-Up Group:   The focus of this group is to help patients review their daily goal of treatment and discuss progress on daily workbooks.  Participation Level:  Active  Participation Quality:  Appropriate  Affect:  Appropriate  Cognitive:  Appropriate  Insight: Appropriate  Engagement in Group:  Developing/Improving  Modes of Intervention:  Discussion  Additional Comments:  Pt stated his goal for today was to focus on his treatment plan and to talk with his doctor about a medication issues. Pt stated he accomplished his goals today. Pt stated his relationship with his family has improved since he was admitted. Pt stated he felt better about himself today. Pt rated his overall day an 7 out of 10. Pt stated his appetite was improving today. Pt stated his sleep last night was pretty good. Pt stated he was in no physical pain today.  Pt deny auditory or visual hallucinations. Pt denies thoughts of harming himself or others. Pt stated he would alert staff if anything changes.  Felipa Furnace 02/27/2020, 12:58 AM

## 2020-02-27 NOTE — Progress Notes (Signed)
   02/27/20 0800  Psych Admission Type (Psych Patients Only)  Admission Status Voluntary  Psychosocial Assessment  Patient Complaints None  Eye Contact Suspiciousness  Facial Expression Flat  Affect Appropriate to circumstance  Speech Logical/coherent  Interaction Assertive  Motor Activity Slow  Appearance/Hygiene Unremarkable  Behavior Characteristics Appropriate to situation  Mood Anxious  Aggressive Behavior  Effect No apparent injury  Thought Process  Coherency WDL  Content Preoccupation  Delusions None reported or observed  Perception WDL  Hallucination None reported or observed  Judgment WDL  Confusion None  Danger to Self  Current suicidal ideation? Denies  Danger to Others  Danger to Others None reported or observed

## 2020-02-27 NOTE — [facility] Group Notes (Signed)
[facility] LCSW Group Therapy Note  Date/Time:  02/27/2020  11:00AM-12:00PM  Type of Therapy and Topic:  Group Therapy:  Music and Mood  Participation Level:  Did Not Attend   Description of Group: In this process group, members listened to a variety of genres of music and identified that different types of music evoke different responses.  Patients were encouraged to identify music that was soothing for them and music that was energizing for them.  Patients discussed how this knowledge can help with wellness and recovery in various ways including managing depression and anxiety as well as encouraging healthy sleep habits.    Therapeutic Goals: Patients will explore the impact of different varieties of music on mood Patients will verbalize the thoughts they have when listening to different types of music Patients will identify music that is soothing to them as well as music that is energizing to them Patients will discuss how to use this knowledge to assist in maintaining wellness and recovery Patients will explore the use of music as a coping skill  Summary of Patient Progress:  Patient was invited to group, did not attend.  Therapeutic Modalities: Solution Focused Brief Therapy Activity   Ambrose Mantle, LCSW

## 2020-02-27 NOTE — Progress Notes (Signed)
Adult Psychoeducational Group Note  Date:  02/27/2020 Time:  9:08 PM  Group Topic/Focus:  Wrap-Up Group:   The focus of this group is to help patients review their daily goal of treatment and discuss progress on daily workbooks.  Participation Level:  Active  Participation Quality:  Appropriate  Affect:  Appropriate  Cognitive:  Appropriate  Insight: Appropriate  Engagement in Group:  Developing/Improving  Modes of Intervention:  Discussion  Additional Comments:  Pt stated his goal for today was to focus on his treatment plan. Pt stated he accomplished his goal today. Pt stated his relationship with his family has improve since he was admitted. Pt stated he felt better about himself today. Pt rated his overall day a 8 out of 10. Pt stated his appetite was pretty good today. Pt stated his sleep last night was pretty good. Pt stated he was in no physical pain today.  Pt deny auditory or visual hallucinations. Pt denies thoughts of harming himself or others. Pt stated he would alert staff if anything changes.   Felipa Furnace 02/27/2020, 9:08 PM

## 2020-02-27 NOTE — Progress Notes (Signed)
   02/27/20 2115  Psych Admission Type (Psych Patients Only)  Admission Status Voluntary  Psychosocial Assessment  Patient Complaints None  Eye Contact Suspiciousness  Facial Expression Flat  Affect Appropriate to circumstance  Speech Logical/coherent  Interaction Assertive  Motor Activity Slow  Appearance/Hygiene Unremarkable  Behavior Characteristics Appropriate to situation  Mood Pleasant;Anxious  Aggressive Behavior  Effect No apparent injury  Thought Process  Coherency WDL  Content Preoccupation  Delusions None reported or observed  Perception WDL  Hallucination None reported or observed  Judgment WDL  Confusion None  Danger to Self  Current suicidal ideation? Denies  Danger to Others  Danger to Others None reported or observed

## 2020-02-28 LAB — GLUCOSE, CAPILLARY: Glucose-Capillary: 94 mg/dL (ref 70–99)

## 2020-02-28 MED ORDER — OLANZAPINE 20 MG PO TBDP: 20.0000 mg | ORAL_TABLET | Freq: Every day | ORAL | 2 refills | Status: DC

## 2020-02-28 NOTE — Progress Notes (Signed)
Pt d/c home as ordered. Ambulatory with a steady gait. All belongings from assigned locker returned to pt at time of d/c. Pt appears to be in no physical distress at time of departure.  

## 2020-02-28 NOTE — [facility] Suicide Risk Assessment (Signed)
[facility] Discharge Suicide Risk Assessment   Principal Problem: Bipolar I disorder [facility] Discharge Diagnoses: Principal Problem:   Bipolar I disorder (HCC) Active Problems:   Bipolar 1 disorder (HCC)   Psychosis (HCC)   Schizophrenia (HCC)   Total Time spent with patient: 45 minutes  Musculoskeletal: Strength & Muscle Tone: within normal limits Gait & Station: normal Patient leans: N/A  Psychiatric Specialty Exam: Review of Systems  Blood pressure 128/83, pulse 96, temperature 97.8 F (36.6 C), temperature source Oral, resp. rate 16, SpO2 100 %.There is no height or weight on file to calculate BMI.  General Appearance: Casual  Eye Contact::  Fair  Speech:  Clear and Coherent409  Volume:  Normal  Mood:  Irritable  Affect:  Restricted  Thought Process:  Coherent and Goal Directed  Orientation:  Full (Time, Place, and Person)  Thought Content:  No acute positive symptoms  Suicidal Thoughts:  No  Homicidal Thoughts:  No  Memory:  Immediate;   Fair Recent;   Fair Remote;   Fair  Judgement:  Fair  Insight:  Fair  Psychomotor Activity:  Normal  Concentration:  Fair  Recall:  [employer] of Knowledge:Fair  Language: Fair  Akathisia:  Negative  Handed:  Right  AIMS (if indicated):     Assets:  Communication Skills Housing Leisure Time Physical Health  Sleep:  Number of Hours: 6  Cognition: WNL  ADL's:  Intact    Mental Status Per Nursing Assessment::   On Admission:  NA  Demographic Factors:  Male  Loss Factors: Loss of significant relationship  Historical Factors: Impulsivity  Risk Reduction Factors:   Sense of responsibility to family and Religious beliefs about death  Continued Clinical Symptoms:  Previous Psychiatric Diagnoses and Treatments  Cognitive Features That Contribute To Risk:  Loss of executive function    Suicide Risk:  Minimal: No identifiable suicidal ideation.  Patients presenting with no risk factors but with morbid ruminations; may be  classified as minimal risk based on the severity of the depressive symptoms  Follow-up Information    [facility] Follow up on 03/03/2020.   Why: You are scheduled for an appointment on 03/03/20 at 10:00 am.  This will be a virtual tele-health appointment.  Contact information: [address] [place] [address] [phone]           Plan Of Care/Follow-up recommendations:  Activity:  nl  Malvin Johns, MD 02/28/2020, 10:59 AM

## 2020-02-28 NOTE — Progress Notes (Signed)
Recreation Therapy Notes  Date: 3.29.21 Time: 1000 Location: [address] Dayroom  Group Topic: Coping Skills  Goal Area(s) Addresses:  Patient will identify positive coping skills. Patient will identify benefit of using coping skills.  Intervention: Worksheet, pencils, white board, marker  Activity: Mind Map.  LRT and patients filled out the first 8 boxes (anger, sadness, depression, anxiety, arguments, self esteem, lack of coping skills and guilt) of the mind map together.  Patients were then given time to come up with at least 3 coping skills for each area identified.  The group would come back together and LRT would write the coping skills on the board.  Education:Coping Skills, Discharge Planning.   Education Outcome: Acknowledges understanding/In group clarification offered/Needs additional education.   Clinical Observations/Feedback: Pt did not attend group session.    Caroll Rancher, LRT/CTRS         Caroll Rancher A 02/28/2020 11:52 AM

## 2020-02-28 NOTE — Plan of Care (Signed)
Pt discharged at 12:15pm via bus given bus pass. Pt given discharge instructions upon discharge, given discharge med education and prescriptions, follow up appointment instructions, all personal belongings returned to pt upon discharge. Pt upon discharge denies suicidal and homicidal ideation, denies hallucinations, denies feelings of depression and anxiety, is calm, and cooperative with discharge process, is alert and oriented to person, place, time and situation. No distress noted or reported no complaints voiced.   Problem: Education: Goal: Knowledge of [place] [facility] Education information/materials will improve Outcome: Adequate for Discharge Goal: Emotional status will improve Outcome: Adequate for Discharge Goal: Mental status will improve Outcome: Adequate for Discharge Goal: Verbalization of understanding the information provided will improve Outcome: Adequate for Discharge   Problem: Activity: Goal: Interest or engagement in activities will improve Outcome: Adequate for Discharge Goal: Sleeping patterns will improve Outcome: Adequate for Discharge   Problem: Coping: Goal: Ability to verbalize frustrations and anger appropriately will improve Outcome: Adequate for Discharge Goal: Ability to demonstrate self-control will improve Outcome: Adequate for Discharge   Problem: Health Behavior/Discharge Planning: Goal: Identification of resources available to assist in meeting health care needs will improve Outcome: Adequate for Discharge Goal: Compliance with treatment plan for underlying cause of condition will improve Outcome: Adequate for Discharge   Problem: Physical Regulation: Goal: Ability to maintain clinical measurements within normal limits will improve Outcome: Adequate for Discharge   Problem: Safety: Goal: Periods of time without injury will increase Outcome: Adequate for Discharge   Problem: Activity: Goal: Will verbalize the importance of balancing  activity with adequate rest periods Outcome: Adequate for Discharge   Problem: Education: Goal: Will be free of psychotic symptoms Outcome: Adequate for Discharge Goal: Knowledge of the prescribed therapeutic regimen will improve Outcome: Adequate for Discharge   Problem: Coping: Goal: Coping ability will improve Outcome: Adequate for Discharge Goal: Will verbalize feelings Outcome: Adequate for Discharge   Problem: Health Behavior/Discharge Planning: Goal: Compliance with prescribed medication regimen will improve Outcome: Adequate for Discharge   Problem: Nutritional: Goal: Ability to achieve adequate nutritional intake will improve Outcome: Adequate for Discharge   Problem: Role Relationship: Goal: Ability to communicate needs accurately will improve Outcome: Adequate for Discharge Goal: Ability to interact with others will improve Outcome: Adequate for Discharge   Problem: Safety: Goal: Ability to redirect hostility and anger into socially appropriate behaviors will improve Outcome: Adequate for Discharge Goal: Ability to remain free from injury will improve Outcome: Adequate for Discharge   Problem: Self-Care: Goal: Ability to participate in self-care as condition permits will improve Outcome: Adequate for Discharge   Problem: Self-Concept: Goal: Will verbalize positive feelings about self Outcome: Adequate for Discharge   Problem: Education: Goal: Utilization of techniques to improve thought processes will improve Outcome: Adequate for Discharge Goal: Knowledge of the prescribed therapeutic regimen will improve Outcome: Adequate for Discharge   Problem: Activity: Goal: Interest or engagement in leisure activities will improve Outcome: Adequate for Discharge Goal: Imbalance in normal sleep/wake cycle will improve Outcome: Adequate for Discharge   Problem: Coping: Goal: Coping ability will improve Outcome: Adequate for Discharge Goal: Will verbalize  feelings Outcome: Adequate for Discharge   Problem: Health Behavior/Discharge Planning: Goal: Ability to make decisions will improve Outcome: Adequate for Discharge Goal: Compliance with therapeutic regimen will improve Outcome: Adequate for Discharge   Problem: Role Relationship: Goal: Will demonstrate positive changes in social behaviors and relationships Outcome: Adequate for Discharge   Problem: Safety: Goal: Ability to disclose and discuss suicidal ideas will improve Outcome: Adequate  for Discharge Goal: Ability to identify and utilize support systems that promote safety will improve Outcome: Adequate for Discharge   Problem: Self-Concept: Goal: Will verbalize positive feelings about self Outcome: Adequate for Discharge Goal: Level of anxiety will decrease Outcome: Adequate for Discharge
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
## 2020-02-28 NOTE — Progress Notes (Signed)
  [facility] Adult Case Management Discharge Plan :  Will you be returning to the same living situation after discharge:  Yes,  home.  At discharge, do you have transportation home?: Yes,  taking the bus. Bus pass on chart. Do you have the ability to pay for your medications: Yes,  [facility] Medicare.  Release of information consent forms completed and in the chart;  Patient's signature needed at discharge.  Patient to Follow up at: Follow-up Information    [facility] Follow up on 03/03/2020.   Why: You are scheduled for an appointment on 03/03/20 at 10:00 am.  This will be a virtual tele-health appointment.  Contact information: [address] [place] [address] [phone]           Next level of care provider has access to [facility] Link:no  Safety Planning and Suicide Prevention discussed: Yes,  with patient.   Has patient been referred to the Quitline?: N/A patient is not a smoker  Patient has been referred for addiction treatment: Yes  Darreld Mclean, LCSWA 02/28/2020, 9:49 AM

## 2020-07-27 ENCOUNTER — Telehealth (HOSPITAL_COMMUNITY): Payer: Self-pay | Admitting: *Deleted

## 2020-07-27 NOTE — Telephone Encounter (Signed)
Called initially unclear why he was calling, defensive. After some back and forth discovered he needed a first time appt. Told him I was not the one that would make his initial appt, this was the nurse line but if he gave me a number I could reach him at I would or front desk would call him back with a med management appt. States he doesn't have a phone could I email him and Clinical research associate accepted his email address. Front desk staff state cant use email for initial appt need a number. Tried to call him back at the number identified on my phone which is [employer] and the two people I spoke with didn't know him and couldn't find him. I will email him to call back to make appt on phone.

## 2020-09-26 ENCOUNTER — Ambulatory Visit (HOSPITAL_COMMUNITY): Payer: 59 | Admitting: Psychiatry

## 2020-09-28 ENCOUNTER — Encounter (HOSPITAL_COMMUNITY): Payer: Self-pay | Admitting: *Deleted

## 2020-09-28 ENCOUNTER — Emergency Department (HOSPITAL_COMMUNITY)
Admission: EM | Admit: 2020-09-28 | Discharge: 2020-09-30 | Disposition: A | Discharge status: Home / Self Care | Payer: 59 | Source: Home / Self Care | Attending: Emergency Medicine | Admitting: Emergency Medicine

## 2020-09-28 ENCOUNTER — Other Ambulatory Visit: Payer: Self-pay

## 2020-09-28 DIAGNOSIS — F29 Unspecified psychosis not due to a substance or known physiological condition: Secondary | ICD-10-CM | POA: Insufficient documentation

## 2020-09-28 DIAGNOSIS — F250 Schizoaffective disorder, bipolar type: Secondary | ICD-10-CM | POA: Insufficient documentation

## 2020-09-28 DIAGNOSIS — Z760 Encounter for issue of repeat prescription: Secondary | ICD-10-CM | POA: Insufficient documentation

## 2020-09-28 DIAGNOSIS — F17210 Nicotine dependence, cigarettes, uncomplicated: Secondary | ICD-10-CM | POA: Insufficient documentation

## 2020-09-28 DIAGNOSIS — F209 Schizophrenia, unspecified: Secondary | ICD-10-CM | POA: Diagnosis present

## 2020-09-28 DIAGNOSIS — Z20822 Contact with and (suspected) exposure to covid-19: Secondary | ICD-10-CM | POA: Insufficient documentation

## 2020-09-28 DIAGNOSIS — F319 Bipolar disorder, unspecified: Secondary | ICD-10-CM | POA: Diagnosis present

## 2020-09-28 LAB — COMPREHENSIVE METABOLIC PANEL
ALT: 27 U/L (ref 0–44)
AST: 31 U/L (ref 15–41)
Albumin: 3.3 g/dL — ABNORMAL LOW (ref 3.5–5.0)
Alkaline Phosphatase: 73 U/L (ref 38–126)
Anion gap: 8 (ref 5–15)
BUN: 12 mg/dL (ref 6–20)
CO2: 25 mmol/L (ref 22–32)
Calcium: 8.6 mg/dL — ABNORMAL LOW (ref 8.9–10.3)
Chloride: 108 mmol/L (ref 98–111)
Creatinine, Ser: 1 mg/dL (ref 0.61–1.24)
GFR, Estimated: >60 mL/min (ref >60)
Glucose, Bld: 99 mg/dL (ref 70–99)
Potassium: 3.7 mmol/L (ref 3.5–5.1)
Sodium: 141 mmol/L (ref 135–145)
Total Bilirubin: 0.6 mg/dL (ref 0.3–1.2)
Total Protein: 5.9 g/dL — ABNORMAL LOW (ref 6.5–8.1)

## 2020-09-28 LAB — CBC WITH DIFFERENTIAL/PLATELET
Abs Immature Granulocytes: 0.01 10*3/uL (ref 0.00–0.07)
Basophils Absolute: 0 10*3/uL (ref 0.0–0.1)
Basophils Relative: 1 %
Eosinophils Absolute: 0.1 10*3/uL (ref 0.0–0.5)
Eosinophils Relative: 2 %
HCT: 37.8 % — ABNORMAL LOW (ref 39.0–52.0)
Hemoglobin: 11.6 g/dL — ABNORMAL LOW (ref 13.0–17.0)
Immature Granulocytes: 0 %
Lymphocytes Relative: 50 %
Lymphs Abs: 2.5 10*3/uL (ref 0.7–4.0)
MCH: 23.1 pg — ABNORMAL LOW (ref 26.0–34.0)
MCHC: 30.7 g/dL (ref 30.0–36.0)
MCV: 75.3 fL — ABNORMAL LOW (ref 80.0–100.0)
Monocytes Absolute: 0.3 10*3/uL (ref 0.1–1.0)
Monocytes Relative: 7 %
Neutro Abs: 2 10*3/uL (ref 1.7–7.7)
Neutrophils Relative %: 40 %
Platelets: 338 10*3/uL (ref 150–400)
RBC: 5.02 MIL/uL (ref 4.22–5.81)
RDW: 15.4 % (ref 11.5–15.5)
WBC: 5.1 10*3/uL (ref 4.0–10.5)
nRBC: 0 % (ref 0.0–0.2)

## 2020-09-28 LAB — ETHANOL: Alcohol, Ethyl (B): <10 mg/dL (ref <10)

## 2020-09-28 LAB — RESPIRATORY PANEL BY RT PCR (FLU A&B, COVID)
Influenza A by PCR: NEGATIVE
Influenza B by PCR: NEGATIVE
SARS Coronavirus 2 by RT PCR: NEGATIVE

## 2020-09-28 LAB — ACETAMINOPHEN LEVEL: Acetaminophen (Tylenol), Serum: <10 ug/mL — ABNORMAL LOW (ref 10–30)

## 2020-09-28 LAB — SALICYLATE LEVEL: Salicylate Lvl: <7 mg/dL — ABNORMAL LOW (ref 7.0–30.0)

## 2020-09-28 MED ORDER — ZIPRASIDONE MESYLATE 20 MG IM SOLR
10.0000 mg | Freq: Once | INTRAMUSCULAR | Status: AC
  Administered 2020-09-28: 10 mg via INTRAMUSCULAR
  Filled 2020-09-28: qty 20

## 2020-09-28 MED ORDER — NICOTINE 21 MG/24HR TD PT24
21.0000 mg | MEDICATED_PATCH | Freq: Every day | TRANSDERMAL | Status: DC
  Filled 2020-09-28: qty 1

## 2020-09-28 MED ORDER — ACETAMINOPHEN 325 MG PO TABS: 650.0000 mg | ORAL_TABLET | ORAL | Status: DC | PRN

## 2020-09-28 MED ORDER — ALUM & MAG HYDROXIDE-SIMETH 200-200-20 MG/5ML PO SUSP: 30.0000 mL | Freq: Four times a day (QID) | ORAL | Status: DC | PRN

## 2020-09-28 MED ORDER — DIPHENHYDRAMINE HCL 50 MG/ML IJ SOLN
50.0000 mg | Freq: Once | INTRAMUSCULAR | Status: AC
  Administered 2020-09-28: 50 mg via INTRAMUSCULAR
  Filled 2020-09-28: qty 1

## 2020-09-28 MED ORDER — ONDANSETRON HCL 4 MG PO TABS: 4.0000 mg | ORAL_TABLET | Freq: Three times a day (TID) | ORAL | Status: DC | PRN

## 2020-09-28 MED ORDER — HALOPERIDOL 5 MG PO TABS
5.0000 mg | ORAL_TABLET | Freq: Once | ORAL | Status: AC | PRN
  Administered 2020-09-29: 5 mg via ORAL
  Filled 2020-09-28 (×2): qty 1

## 2020-09-28 MED ORDER — STERILE WATER FOR INJECTION IJ SOLN
INTRAMUSCULAR | Status: AC
  Filled 2020-09-28: qty 10

## 2020-09-28 NOTE — BH Assessment (Signed)
Per Denzil Magnuson, NP, Inpatient is recommended for this patient

## 2020-09-28 NOTE — ED Notes (Signed)
Pt refusing all meds; agitated, standing at door, speaking in nonsensical manner, cursing; Dr. Jacqulyn Bath notified

## 2020-09-28 NOTE — ED Notes (Addendum)
Pt becoming increasingly agitated stating "I'm going to start throwing shit at people"

## 2020-09-28 NOTE — ED Notes (Signed)
Lunch Tray Ordered @ 1042. 

## 2020-09-28 NOTE — ED Notes (Signed)
Pt resting in bed, responding to external stimuli Pt making statements that "OJ is after him" and accusing the staff of working the OJ to get him.   Pt states "I have snapped before and I will again if you make me" Pt remains calm and cooperative for blood draw.   States he needs his medications refilled but does not know which medications he needs

## 2020-09-28 NOTE — ED Notes (Addendum)
Pt moved to Purple room 50. Pt actively responding to external stimuli, standing at door way of room, non-compliant with medication administration, refused haldol oral.

## 2020-09-28 NOTE — ED Notes (Signed)
Sitter at bedside.

## 2020-09-28 NOTE — ED Notes (Signed)
Pt had $4 in dollar bills and a white rock wrapped in newspaper. Locked these belongings in with belongings in locker # 6.

## 2020-09-28 NOTE — ED Notes (Signed)
Pt dinner tray ordered.

## 2020-09-28 NOTE — ED Triage Notes (Signed)
Pt arrives ambulatory to triage stating he has schizophrenia and is here to figure out which medicines he should be on. Says that he was diagnosed at [facility] and they gave him some medicines that aren't working. He says he has "rats, like mice" all over in his body. He is cooperative, denies SI or HI.

## 2020-09-28 NOTE — ED Notes (Signed)
Dinner Tray Ordered @ 1657. 
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
## 2020-09-28 NOTE — ED Notes (Signed)
Pt received IM medication without incident.
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
## 2020-09-28 NOTE — ED Notes (Signed)
Pt given a sandwich, cup of applesauce, and a cup of sprite.

## 2020-09-29 MED ORDER — OLANZAPINE 5 MG PO TBDP
20.0000 mg | ORAL_TABLET | Freq: Every day | ORAL | Status: DC
  Administered 2020-09-29 – 2020-09-30 (×2): 20 mg via ORAL
  Filled 2020-09-29 (×3): qty 4

## 2020-09-29 NOTE — ED Notes (Signed)
Pt's lunch ordered at 1010. 

## 2020-09-29 NOTE — ED Notes (Signed)
Pt behaviors continue to escalate, disrobing, pt restless, exaggerated mannerisms, speech pressured and disorganized. ED provider [facility] aware. Ordered to give daily zyprexa 20mg  dose now. Will continue to monitor.
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
## 2020-09-29 NOTE — ED Notes (Signed)
TTS machine to bedside  

## 2020-09-29 NOTE — ED Notes (Signed)
ED provider at bedside.

## 2020-09-29 NOTE — ED Notes (Signed)
Pt in room, restless, laughing, appears to be responding to internal stimuli. Sitter at bedside. Will continue to monitor.

## 2020-09-30 ENCOUNTER — Inpatient Hospital Stay (HOSPITAL_COMMUNITY)
Admission: AD | Admit: 2020-09-30 | Discharge: 2020-10-03 | DRG: 885 | Disposition: A | Discharge status: Home / Self Care | Payer: 59 | Source: Intra-hospital | Attending: Psychiatry | Admitting: Psychiatry

## 2020-09-30 ENCOUNTER — Other Ambulatory Visit: Payer: Self-pay | Admitting: Psychiatry

## 2020-09-30 DIAGNOSIS — F250 Schizoaffective disorder, bipolar type: Secondary | ICD-10-CM | POA: Diagnosis present

## 2020-09-30 DIAGNOSIS — F1510 Other stimulant abuse, uncomplicated: Secondary | ICD-10-CM | POA: Diagnosis present

## 2020-09-30 DIAGNOSIS — F1410 Cocaine abuse, uncomplicated: Secondary | ICD-10-CM | POA: Diagnosis present

## 2020-09-30 DIAGNOSIS — F1010 Alcohol abuse, uncomplicated: Secondary | ICD-10-CM | POA: Diagnosis present

## 2020-09-30 DIAGNOSIS — Z5900 Homelessness unspecified: Secondary | ICD-10-CM

## 2020-09-30 DIAGNOSIS — F17210 Nicotine dependence, cigarettes, uncomplicated: Secondary | ICD-10-CM | POA: Diagnosis present

## 2020-09-30 DIAGNOSIS — F1110 Opioid abuse, uncomplicated: Secondary | ICD-10-CM | POA: Diagnosis present

## 2020-09-30 DIAGNOSIS — Z91128 Patient's intentional underdosing of medication regimen for other reason: Secondary | ICD-10-CM

## 2020-09-30 DIAGNOSIS — Y900 Blood alcohol level of less than 20 mg/100 ml: Secondary | ICD-10-CM | POA: Diagnosis present

## 2020-09-30 DIAGNOSIS — Z20822 Contact with and (suspected) exposure to covid-19: Secondary | ICD-10-CM | POA: Diagnosis present

## 2020-09-30 DIAGNOSIS — Z9114 Patient's other noncompliance with medication regimen: Secondary | ICD-10-CM

## 2020-09-30 DIAGNOSIS — F1910 Other psychoactive substance abuse, uncomplicated: Secondary | ICD-10-CM | POA: Diagnosis not present

## 2020-09-30 DIAGNOSIS — F1210 Cannabis abuse, uncomplicated: Secondary | ICD-10-CM | POA: Diagnosis present

## 2020-09-30 LAB — RAPID URINE DRUG SCREEN, HOSP PERFORMED
Amphetamines: NOT DETECTED
Barbiturates: NOT DETECTED
Benzodiazepines: NOT DETECTED
Cocaine: NOT DETECTED
Opiates: NOT DETECTED
Tetrahydrocannabinol: NOT DETECTED

## 2020-09-30 NOTE — ED Notes (Signed)
Lunch Tray Ordered @ 1009. 

## 2020-09-30 NOTE — ED Notes (Signed)
Report called to [place], Charity fundraiser at [facility]

## 2020-09-30 NOTE — ED Notes (Signed)
Patient awake, ambulated to bathroom. Calm and cooperative with staff. Currently eating breakfast in room.

## 2020-09-30 NOTE — ED Notes (Signed)
Patient calm and cooperative, eating lunch. Pt states he has no other needs at this time.

## 2020-09-30 NOTE — Progress Notes (Signed)
Pt has been accepted to Room 406-02 at [facility] to the service of MD Clary.  Report may be called to [phone] when transportation has been arranged.

## 2020-09-30 NOTE — Progress Notes (Addendum)
Update: Pt accepted to [facility] per [facility]. Pt has been accepted to bed 406-02, Accepting provider: Dr. [name] MD   Patient meets criteria for inpatient treatment. There are no appropriate/available beds at [facility] currently. CSW faxed referrals to the following facilities for review:  [place], [place], [facility], [place], [place], [place], [place], [address], [place], [place], [place], [address], [facility], [place], [place].  TTS will continue to seek bed placement.   Trula Slade, MSW, LCSW Clinical Social Worker 09/30/2020 2:42 PM

## 2020-09-30 NOTE — ED Notes (Signed)
Dinner Tray Ordered @ 1706. 

## 2020-09-30 NOTE — BH Assessment (Signed)
Reassessment Note:  Per initial assessment: Patient was seen by his [facility] Case Worker today and was sent tot the hospital for evaluation.  Patient is on probation for B&E/Larceny, has a history of drug use as well as a schizoaffective diorder bipolar type diagnosis and he has not been complant with his medications.  He presents as very disorganized today, he appears to almost be manic.  It is very hard to follow what he is saying and he jumps from one subject to another without connection.  He admists to alcohol, cocaine, marijuana, ecstacy and heroin use, but it is very difficult to try to get a history of his use due to his disorganization.  Patient admits today that he hears and sees things due to his non-compliance with his medication, but his hallucinations are not commanding him to hurt himself or others.  Patient states, "the medication had bad effects on me and made me look like a snake, not a gerbil, but it was killing me."  Patient states, "the medication was not working and they need to change my medication."  He states that he also sees a mouse jumping on me and I could feel them treading my skin." Patient states that he has not been sleeping or eating well."  Upon assessment today, patient reports he is"doing well.  I slept and I'm eating better."  Upon discussion of mood, patient chose to respond with an "8" rating, describing an 8 score as feeling "motivated."   Patient is denying SI, HI and AVH.  He does not appear to be responding to internal stimuli currently.    Per RN documentation from yesterday, patient required medication due to escalated and inappropriate behaviors.  Per Maxie Barb, NP patient would benefit from continued monitoring/evaluation.  He continues to meet inpatient criteria.  SW has referred patient to multiple inpatient facilities.

## 2020-09-30 NOTE — ED Notes (Signed)
Pt resting quietly at this time with eyes closed 

## 2020-10-01 ENCOUNTER — Encounter (HOSPITAL_COMMUNITY): Payer: Self-pay | Admitting: Psychiatry

## 2020-10-01 ENCOUNTER — Other Ambulatory Visit: Payer: Self-pay

## 2020-10-01 DIAGNOSIS — F1910 Other psychoactive substance abuse, uncomplicated: Secondary | ICD-10-CM

## 2020-10-01 DIAGNOSIS — Z5900 Homelessness unspecified: Secondary | ICD-10-CM

## 2020-10-01 DIAGNOSIS — F250 Schizoaffective disorder, bipolar type: Principal | ICD-10-CM

## 2020-10-01 MED ORDER — OLANZAPINE 10 MG PO TBDP: 10.0000 mg | ORAL_TABLET | Freq: Three times a day (TID) | ORAL | Status: DC | PRN

## 2020-10-01 MED ORDER — HALOPERIDOL 5 MG PO TABS
10.0000 mg | ORAL_TABLET | Freq: Two times a day (BID) | ORAL | Status: DC
  Administered 2020-10-01 – 2020-10-03 (×4): 10 mg via ORAL
  Filled 2020-10-01 (×7): qty 2

## 2020-10-01 MED ORDER — LORAZEPAM 1 MG PO TABS: 2.0000 mg | ORAL_TABLET | Freq: Four times a day (QID) | ORAL | Status: DC | PRN

## 2020-10-01 MED ORDER — BENZTROPINE MESYLATE 1 MG PO TABS: 2.0000 mg | ORAL_TABLET | Freq: Two times a day (BID) | ORAL | Status: DC | PRN

## 2020-10-01 MED ORDER — HYDROXYZINE HCL 25 MG PO TABS: 25.0000 mg | ORAL_TABLET | Freq: Three times a day (TID) | ORAL | Status: DC | PRN

## 2020-10-01 MED ORDER — BENZTROPINE MESYLATE 1 MG PO TABS
1.0000 mg | ORAL_TABLET | Freq: Two times a day (BID) | ORAL | Status: DC
  Administered 2020-10-01 – 2020-10-03 (×4): 1 mg via ORAL
  Filled 2020-10-01 (×8): qty 1

## 2020-10-01 MED ORDER — MAGNESIUM HYDROXIDE 400 MG/5ML PO SUSP: 30.0000 mL | Freq: Every day | ORAL | Status: DC | PRN

## 2020-10-01 MED ORDER — BENZTROPINE MESYLATE 1 MG/ML IJ SOLN: 2.0000 mg | Freq: Two times a day (BID) | INTRAMUSCULAR | Status: DC | PRN

## 2020-10-01 MED ORDER — ACETAMINOPHEN 325 MG PO TABS: 650.0000 mg | ORAL_TABLET | Freq: Four times a day (QID) | ORAL | Status: DC | PRN

## 2020-10-01 MED ORDER — LORAZEPAM 1 MG PO TABS: 1.0000 mg | ORAL_TABLET | ORAL | Status: DC | PRN

## 2020-10-01 MED ORDER — ZIPRASIDONE MESYLATE 20 MG IM SOLR: 20.0000 mg | INTRAMUSCULAR | Status: DC | PRN

## 2020-10-01 MED ORDER — DIPHENHYDRAMINE HCL 25 MG PO CAPS: 50.0000 mg | ORAL_CAPSULE | Freq: Four times a day (QID) | ORAL | Status: DC | PRN

## 2020-10-01 MED ORDER — ALUM & MAG HYDROXIDE-SIMETH 200-200-20 MG/5ML PO SUSP: 30.0000 mL | ORAL | Status: DC | PRN

## 2020-10-01 MED ORDER — LORAZEPAM 2 MG/ML IJ SOLN: 2.0000 mg | Freq: Four times a day (QID) | INTRAMUSCULAR | Status: DC | PRN

## 2020-10-01 MED ORDER — TRAZODONE HCL 100 MG PO TABS
100.0000 mg | ORAL_TABLET | Freq: Every evening | ORAL | Status: DC | PRN
  Filled 2020-10-01 (×8): qty 1
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
## 2020-10-01 NOTE — Plan of Care (Signed)
Progress note  D: pt found in bed; pt allowed to rest. Pt agreeable to assessment after awakening. Pt denies any physical complaints or pain. Pt states they feel like they need their medications changed. Pt states they don't want haldol dec because they feel they are allergic to this. Pt is agreeable to other medications. Pt denies follow up planning at this time. Pt wants to work with the Child psychotherapist for discharge planning as they are currently homeless. Pt continues to be isolative to their room. Pt is pleasant. Pt denies si/hi/ah/vh and verbally agrees to approach staff if these become apparent or before harming themself/others while at [facility]. Pt may be responding to internal stimuli though.  A: Pt provided support and encouragement. Pt given medication per protocol and standing orders. Q19m safety checks implemented and continued.  R: Pt safe on the unit. Will continue to monitor.  Pt progressing in the following metrics  Problem: Education: Goal: Knowledge of [facility] Education information/materials will improve Outcome: Progressing Goal: Emotional status will improve Outcome: Progressing Goal: Mental status will improve Outcome: Progressing Goal: Verbalization of understanding the information provided will improve Outcome: Progressing

## 2020-10-01 NOTE — Progress Notes (Signed)
Pt states they have court on 11/3 in [place].

## 2020-10-01 NOTE — [facility] Suicide Risk Assessment (Signed)
[facility] INPATIENT:  Family/Significant Other Suicide Prevention Education  Suicide Prevention Education:  Patient unable to provide consent for Family/Significant Other Suicide Prevention Education: The patient has been unable to provide written consent for family/significant other to be provided Family/Significant Other Suicide Prevention Education during admission and/or prior to discharge. Physician notified.  SPE completed with patient, as patient has been unable to provide consent for family contact. SPE pamphlet placed on chart for patient to share with supports at discharge.  Zan Orlick, LCSWA Clinicial Social Worker [facility] 

## 2020-10-01 NOTE — [facility] Counselor (Signed)
CSW offered shelter resources to pt but pt declined stating "thank you but I do not want these.".  Fredirick Lathe, LCSWA Clinicial Social Worker [employer]
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
## 2020-10-01 NOTE — Tx Team (Signed)
Initial Treatment Plan 10/01/2020 4:12 AM Jeffrey Michael SHN:[id number]    PATIENT STRESSORS: Financial difficulties   PATIENT STRENGTHS: General fund of knowledge   PATIENT IDENTIFIED PROBLEMS: Anxiety  Delusional Thoughts                   DISCHARGE CRITERIA:  Need for constant or close observation no longer present  PRELIMINARY DISCHARGE PLAN: Return to previous living arrangement  PATIENT/FAMILY INVOLVEMENT: This treatment plan has been presented to and reviewed with the patient, Jeffrey Michael, and/or family member.  The patient and family have been given the opportunity to ask questions and make suggestions.  [name], RN 10/01/2020, 4:12 AM
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
## 2020-10-02 MED ORDER — FOLIC ACID 1 MG PO TABS
1.0000 mg | ORAL_TABLET | Freq: Every day | ORAL | Status: DC
  Administered 2020-10-02 – 2020-10-03 (×2): 1 mg via ORAL
  Filled 2020-10-02 (×4): qty 1

## 2020-10-02 MED ORDER — THIAMINE HCL 100 MG PO TABS
100.0000 mg | ORAL_TABLET | Freq: Every day | ORAL | Status: DC
  Administered 2020-10-02 – 2020-10-03 (×2): 100 mg via ORAL
  Filled 2020-10-02 (×4): qty 1

## 2020-10-02 NOTE — Progress Notes (Signed)
[facility] MD Progress Note  10/02/2020 1:17 PM Jeffrey Michael  MRN:  [id number] Subjective: Patient is a 34 year old male with a past psychiatric history significant for schizoaffective disorder; bipolar type who presented to the [facility] emergency department on 09/28/2020 after being noncompliant with his treatment.  It was noted that he was very hard to follow in the emergency department, and admitted to alcohol, cocaine, marijuana, ecstasy and heroin usage.  He admitted that he had been noncompliant with his medications since he had been discharged from the behavioral health hospital in March of this year.  Objective: Patient is seen and examined.  Patient is a 34 year old male with the above-stated past psychiatric history who is seen in follow-up.  On his last admission at our facility in March of this year his discharge medications included ibuprofen and olanzapine.  He had been on Haldol and Haldol Decanoate but those were stopped on the last admission.  On this admission he was restarted on Haldol 10 mg p.o. twice daily.  He is not a great historian.  He stated the reason why want to come in the hospital was because of being noncompliant with his medicines and he wanted to get back on his medicines.  He denied auditory or visual hallucinations.  He denied suicidal or homicidal ideation.  Review of his admission laboratories revealed essentially normal electrolytes including creatinine and liver function enzymes.  His CBC showed a mild anemia with a hemoglobin of 11.6 and hematocrit of 37.8.  His MCV and MCH were both low at 75.3 and 23.1.  Differential was normal.  His acetaminophen was less than 10.  Blood alcohol was less than 10, drug screen was completely negative.  EKG showed a sinus rhythm with an incomplete right bundle branch block and a normal QTc interval.  His vital signs are stable, he is afebrile.  He slept 6.5 hours last night.  Principal Problem: Schizoaffective disorder,  bipolar type (HCC) Diagnosis: Principal Problem:   Schizoaffective disorder, bipolar type (HCC) Active Problems:   Polysubstance abuse (HCC)   Homelessness  Total Time spent with patient: 20 minutes  Past Psychiatric History: See admission H&P  Past Medical History:  Past Medical History:  Diagnosis Date  . Bipolar 1 disorder (HCC)   . Depression   . Schizophrenia (HCC)    History reviewed. No pertinent surgical history. Family History: History reviewed. No pertinent family history. Family Psychiatric  History: See admission H&P Social History:  Social History   Substance and Sexual Activity  Alcohol Use Yes   Comment: denies to this RN     Social History   Substance and Sexual Activity  Drug Use Yes  . Types: Marijuana    Social History   Socioeconomic History  . Marital status: Single    Spouse name: Not on file  . Number of children: Not on file  . Years of education: Not on file  . Highest education level: Not on file  Occupational History  . Not on file  Tobacco Use  . Smoking status: Current Every Day Smoker    Types: Cigarettes  . Smokeless tobacco: Never Used  Substance and Sexual Activity  . Alcohol use: Yes    Comment: denies to this RN  . Drug use: Yes    Types: Marijuana  . Sexual activity: Not Currently    Birth control/protection: Abstinence  Other Topics Concern  . Not on file  Social History Narrative  . Not on file   Social  Determinants of Health   Financial Resource Strain:   . Difficulty of Paying Living Expenses: Not on file  Food Insecurity:   . Worried About Programme researcher, broadcasting/film/videounning Out of Food in the Last Year: Not on file  . Ran Out of Food in the Last Year: Not on file  Transportation Needs:   . Lack of Transportation (Medical): Not on file  . Lack of Transportation (Non-Medical): Not on file  Physical Activity:   . Days of Exercise per Week: Not on file  . Minutes of Exercise per Session: Not on file  Stress:   . Feeling of Stress :  Not on file  Social Connections:   . Frequency of Communication with Friends and Family: Not on file  . Frequency of Social Gatherings with Friends and Family: Not on file  . Attends Religious Services: Not on file  . Active Member of Clubs or Organizations: Not on file  . Attends BankerClub or Organization Meetings: Not on file  . Marital Status: Not on file   Additional Social History:                         Sleep: Good  Appetite:  Good  Current Medications: Current Facility-Administered Medications  Medication Dose Route Frequency Provider Last Rate Last Admin  . acetaminophen (TYLENOL) tablet 650 mg  650 mg Oral Q6H PRN Mariel CraftMaurer, Sheila M, MD      . alum & mag hydroxide-simeth (MAALOX/MYLANTA) 200-200-20 MG/5ML suspension 30 mL  30 mL Oral Q4H PRN Mariel CraftMaurer, Sheila M, MD      . benztropine (COGENTIN) tablet 1 mg  1 mg Oral BID Mariel CraftMaurer, Sheila M, MD   1 mg at 10/02/20 0939  . benztropine (COGENTIN) tablet 2 mg  2 mg Oral BID PRN Mariel CraftMaurer, Sheila M, MD       Or  . benztropine mesylate (COGENTIN) injection 2 mg  2 mg Intramuscular BID PRN Mariel CraftMaurer, Sheila M, MD      . diphenhydrAMINE (BENADRYL) capsule 50 mg  50 mg Oral Q6H PRN Mariel CraftMaurer, Sheila M, MD      . haloperidol (HALDOL) tablet 10 mg  10 mg Oral BID Mariel CraftMaurer, Sheila M, MD   10 mg at 10/02/20 [id number]  . hydrOXYzine (ATARAX/VISTARIL) tablet 25 mg  25 mg Oral TID PRN Mariel CraftMaurer, Sheila M, MD      . LORazepam (ATIVAN) tablet 2 mg  2 mg Oral Q6H PRN Mariel CraftMaurer, Sheila M, MD       Or  . LORazepam (ATIVAN) injection 2 mg  2 mg Intramuscular Q6H PRN Mariel CraftMaurer, Sheila M, MD      . OLANZapine zydis [facility](ZYPREXA) disintegrating tablet 10 mg  10 mg Oral Q8H PRN Mariel CraftMaurer, Sheila M, MD       And  . LORazepam (ATIVAN) tablet 1 mg  1 mg Oral PRN Mariel CraftMaurer, Sheila M, MD       And  . ziprasidone (GEODON) injection 20 mg  20 mg Intramuscular PRN Mariel CraftMaurer, Sheila M, MD      . magnesium hydroxide (MILK OF MAGNESIA) suspension 30 mL  30 mL Oral Daily PRN Mariel CraftMaurer, Sheila M, MD       . traZODone (DESYREL) tablet 100 mg  100 mg Oral QHS,MR X 1 Mariel CraftMaurer, Sheila M, MD        Lab Results: No results found for this or any previous visit (from the past 48 hour(s)).  Blood Alcohol level:  Lab Results  Component Value Date  ETH <10 09/28/2020   ETH <10 11/18/2019    Metabolic Disorder Labs: Lab Results  Component Value Date   HGBA1C 6.3 (H) 02/24/2020   MPG 134.11 02/24/2020   Lab Results  Component Value Date   PROLACTIN 13.4 02/24/2020   Lab Results  Component Value Date   CHOL 112 02/24/2020   TRIG 69 02/24/2020   HDL 35 (L) 02/24/2020   CHOLHDL 3.2 02/24/2020   VLDL 14 02/24/2020   LDLCALC 63 02/24/2020   LDLCALC 40 07/02/2014    Physical Findings: AIMS: Facial and Oral Movements Muscles of Facial Expression: None, normal Lips and Perioral Area: None, normal Jaw: None, normal Tongue: None, normal,Extremity Movements Upper (arms, wrists, hands, fingers): None, normal Lower (legs, knees, ankles, toes): None, normal, Trunk Movements Neck, shoulders, hips: None, normal, Overall Severity Severity of abnormal movements (highest score from questions above): None, normal Incapacitation due to abnormal movements: None, normal Patient's awareness of abnormal movements (rate only patient's report): No Awareness, Dental Status Current problems with teeth and/or dentures?: No Does patient usually wear dentures?: No  CIWA:    COWS:     Musculoskeletal: Strength & Muscle Tone: within normal limits Gait & Station: normal Patient leans: N/A  Psychiatric Specialty Exam: Physical Exam Vitals and nursing note reviewed.  HENT:     Head: Normocephalic and atraumatic.  Pulmonary:     Effort: Pulmonary effort is normal.  Neurological:     General: No focal deficit present.     Mental Status: He is alert and oriented to person, place, and time.     Review of Systems  Blood pressure 125/76, pulse 70, temperature 97.8 F (36.6 C), temperature source Oral,  resp. rate 16, height 5\' 11"  (1.803 m), weight 66.5 kg, SpO2 100 %.Body mass index is 20.43 kg/m.  General Appearance: Casual  Eye Contact:  Fair  Speech:  Normal Rate  Volume:  Normal  Mood:  Anxious  Affect:  Congruent  Thought Process:  Coherent and Descriptions of Associations: Circumstantial  Orientation:  Full (Time, Place, and Person)  Thought Content:  Delusions and Paranoid Ideation  Suicidal Thoughts:  No  Homicidal Thoughts:  No  Memory:  Immediate;   Poor Recent;   Poor Remote;   Poor  Judgement:  Impaired  Insight:  Lacking  Psychomotor Activity:  Normal  Concentration:  Concentration: Fair and Attention Span: Fair  Recall:  of Knowledge:  Fair  Language:  Good  Akathisia:  Negative  Handed:  Right  AIMS (if indicated):     Assets:  Desire for Improvement Resilience  ADL's:  Intact  Cognition:  WNL  Sleep:  Number of Hours: 6.5     Treatment Plan Summary: Daily contact with patient to assess and evaluate symptoms and progress in treatment, Medication management and Plan : Patient is seen and examined.  Patient is a 34 year old male with the above-stated past psychiatric history who is seen in follow-up.   Diagnosis: 1.  Schizoaffective disorder; bipolar type versus schizophrenia 2.  Microcytic anemia  Pertinent findings on examination today: 1.  Still paranoid, but denies all symptoms. 2.  Compliant with oral Haldol, but consideration of long-acting Haldol Decanoate for compliance reasons. 3.  Decreased MCV and MCH  Plan: 1.  Continue Cogentin 1 mg p.o. twice daily for side effects of medication. 2.  Continue Benadryl 50 mg p.o. every 6 hours as needed itching or allergies. 3.  Continue Haldol 10 mg p.o. twice daily for psychosis and mood  stability. 4.  Continue lorazepam 2 mg p.o. or IM every 6 hours as needed agitation. 5.  Continue Zyprexa agitation protocol as needed. 6.  Continue trazodone 100 mg p.o. nightly for insomnia. 7.  Add  folic acid 1 mg p.o. daily for anemia. 8.  Add thiamine 100 mg p.o. daily for anemia. 9.  Get anemia panel to assess iron status 10.  Discussed with patient long-acting Haldol Decanoate injection. 11.  Disposition planning-in progress.  Antonieta Pert, MD 10/02/2020, 1:17 PM

## 2020-10-02 NOTE — [facility] Group Notes (Signed)
[facility] LCSW Group Therapy  10/02/2020 1:42 PM   Type of Therapy and Topic: Group Therapy: Anger Management   Description of Group: In this group, patients will learn helpful strategies and techniques to manage anger, express anger in alternative ways, change hostile attitudes, and prevent aggressive acts, such as verbal abuse and violence.This group will be process-oriented and eductional, with patients participating in exploration of their own experiences as well as giving and receiving support and challenge from other group members.  Therapeutic Goals: 1. Patient will learn to manage anger. 2. Patient will learn to stop violence or the threat of violence. 3. Patient will learn to develop self control over thoughts and actions. 4. Patient will receive support and feedback from others  Therapeutic Modalities: Cognitive Behavioral Therapy Solution Focused Therapy Motivational Interviewing  Type of Therapy:  Group Therapy  Participation Level:  Did Not Attend  Summary of Progress/Problems: Pt did not attend   Casimir Barcellos A Frankee Gritz 10/02/2020, 1:42 PM 

## 2020-10-02 NOTE — [facility] Group Notes (Signed)
The focus of this group is to help patients establish daily goals to achieve during treatment and discuss how the patient can incorporate goal setting into their daily lives to aide in recovery.  Pt did not attend group 

## 2020-10-02 NOTE — Progress Notes (Signed)
D- Patient alert and oriented. Patient affect/mood is calm. Patient denies SI, HI, AVH, and pain. No daily goal stated.    A- Scheduled medications administered to patient, per MD orders. Support and encouragement provided.  Routine safety checks conducted every 15 minutes.  Patient informed to notify staff with problems or concerns.  R- No adverse drug reactions noted. Patient contracts for safety at this time. Patient compliant with medications and treatment plan. Patient receptive, calm, and cooperative. Patient interacts minimally with others on the unit.  Patient remains safe at this time.            [place] NOVEL CORONAVIRUS (COVID-19) DAILY CHECK-OFF SYMPTOMS - answer yes or no to each - every day NO YES  Have you had a fever in the past 24 hours?   Fever (Temp > 37.80C / 100F) X    Have you had any of these symptoms in the past 24 hours?  New Cough   Sore Throat    Shortness of Breath   Difficulty Breathing   Unexplained Body Aches   X    Have you had any one of these symptoms in the past 24 hours not related to allergies?    Runny Nose   Nasal Congestion   Sneezing   X    If you have had runny nose, nasal congestion, sneezing in the past 24 hours, has it worsened?   X    EXPOSURES - check yes or no X    Have you traveled outside the state in the past 14 days?   X    Have you been in contact with someone with a confirmed diagnosis of COVID-19 or PUI in the past 14 days without wearing appropriate PPE?   X    Have you been living in the same home as a person with confirmed diagnosis of COVID-19 or a PUI (household contact)?     X    Have you been diagnosed with COVID-19?     X                                                                                                                             What to do next: Answered NO to all: Answered YES to anything:    Proceed with unit schedule Follow the [facility] Inpatient Flowsheet.

## 2020-10-02 NOTE — Progress Notes (Signed)
   10/02/20 0058  Psych Admission Type (Psych Patients Only)  Admission Status Involuntary  Psychosocial Assessment  Patient Complaints None  Eye Contact Fair  Facial Expression Anxious;Sullen;Sad;Worried  Affect Anxious;Sad;Sullen  Speech Logical/coherent  Interaction Assertive;Isolative  Motor Activity Slow  Appearance/Hygiene In scrubs  Behavior Characteristics Cooperative  Mood Depressed  Thought Process  Coherency Concrete thinking  Content Paranoia  Delusions Paranoid  Perception Hallucinations  Hallucination Auditory  Judgment Poor  Confusion WDL  Danger to Self  Current suicidal ideation? Denies  Danger to Others  Danger to Others None reported or observed

## 2020-10-02 NOTE — Plan of Care (Signed)
  Problem: Education: Goal: Mental status will improve Outcome: Progressing   

## 2020-10-02 NOTE — Tx Team (Signed)
Interdisciplinary Treatment and Diagnostic Plan Update  10/02/2020 Time of Session: 10:05am Jeffrey Michael MRN: [id number]  Principal Diagnosis: Schizoaffective disorder, bipolar type [facility])  Secondary Diagnoses: Principal Problem:   Schizoaffective disorder, bipolar type ([place]) Active Problems:   Polysubstance abuse ([place])   Homelessness   Current Medications:  Current Facility-Administered Medications  Medication Dose Route Frequency Provider Last Rate Last Admin  . acetaminophen (TYLENOL) tablet 650 mg  650 mg Oral Q6H PRN Lavella Hammock, MD      . alum & mag hydroxide-simeth (MAALOX/MYLANTA) 200-200-20 MG/5ML suspension 30 mL  30 mL Oral Q4H PRN Lavella Hammock, MD      . benztropine (COGENTIN) tablet 1 mg  1 mg Oral BID Lavella Hammock, MD   1 mg at 10/02/20 0939  . benztropine (COGENTIN) tablet 2 mg  2 mg Oral BID PRN Lavella Hammock, MD       Or  . benztropine mesylate (COGENTIN) injection 2 mg  2 mg Intramuscular BID PRN Lavella Hammock, MD      . diphenhydrAMINE (BENADRYL) capsule 50 mg  50 mg Oral Q6H PRN Lavella Hammock, MD      . haloperidol (HALDOL) tablet 10 mg  10 mg Oral BID Lavella Hammock, MD   10 mg at 10/02/20 8341  . hydrOXYzine (ATARAX/VISTARIL) tablet 25 mg  25 mg Oral TID PRN Lavella Hammock, MD      . LORazepam (ATIVAN) tablet 2 mg  2 mg Oral Q6H PRN Lavella Hammock, MD       Or  . LORazepam (ATIVAN) injection 2 mg  2 mg Intramuscular Q6H PRN Lavella Hammock, MD      . OLANZapine zydis [facility]) disintegrating tablet 10 mg  10 mg Oral Q8H PRN Lavella Hammock, MD       And  . LORazepam (ATIVAN) tablet 1 mg  1 mg Oral PRN Lavella Hammock, MD       And  . ziprasidone (GEODON) injection 20 mg  20 mg Intramuscular PRN Lavella Hammock, MD      . magnesium hydroxide (MILK OF MAGNESIA) suspension 30 mL  30 mL Oral Daily PRN Lavella Hammock, MD      . traZODone (DESYREL) tablet 100 mg  100 mg Oral QHS,MR X 1 Lavella Hammock, MD       PTA  Medications: Medications Prior to Admission  Medication Sig Dispense Refill Last Dose  . acetaminophen (TYLENOL) 500 MG tablet Take 1,000 mg by mouth every 6 (six) hours as needed for headache (pain).     Marland Kitchen OLANZapine zydis (ZYPREXA) 20 MG disintegrating tablet Take 1 tablet (20 mg total) by mouth at bedtime. (Patient not taking: Reported on 09/29/2020) 90 tablet 2     Patient Stressors: Financial difficulties  Patient Strengths: General fund of knowledge  Treatment Modalities: Medication Management, Group therapy, Case management,  1 to 1 session with clinician, Psychoeducation, Recreational therapy.   Physician Treatment Plan for Primary Diagnosis: Schizoaffective disorder, bipolar type ([place]) Long Term Goal(s): Improvement in symptoms so as ready for discharge Improvement in symptoms so as ready for discharge   Short Term Goals: Ability to identify changes in lifestyle to reduce recurrence of condition will improve Ability to verbalize feelings will improve Ability to identify and develop effective coping behaviors will improve Ability to maintain clinical measurements within normal limits will improve Compliance with prescribed medications will improve Ability to identify changes in lifestyle to reduce recurrence of  condition will improve Ability to verbalize feelings will improve Ability to disclose and discuss suicidal ideas Ability to demonstrate self-control will improve Ability to identify and develop effective coping behaviors will improve Ability to maintain clinical measurements within normal limits will improve Compliance with prescribed medications will improve Ability to identify triggers associated with substance abuse/mental health issues will improve  Medication Management: Evaluate patient's response, side effects, and tolerance of medication regimen.  Therapeutic Interventions: 1 to 1 sessions, Unit Group sessions and Medication administration.  Evaluation of  Outcomes: Not Met  Physician Treatment Plan for Secondary Diagnosis: Principal Problem:   Schizoaffective disorder, bipolar type ([place]) Active Problems:   Polysubstance abuse ([place])   Homelessness  Long Term Goal(s): Improvement in symptoms so as ready for discharge Improvement in symptoms so as ready for discharge   Short Term Goals: Ability to identify changes in lifestyle to reduce recurrence of condition will improve Ability to verbalize feelings will improve Ability to identify and develop effective coping behaviors will improve Ability to maintain clinical measurements within normal limits will improve Compliance with prescribed medications will improve Ability to identify changes in lifestyle to reduce recurrence of condition will improve Ability to verbalize feelings will improve Ability to disclose and discuss suicidal ideas Ability to demonstrate self-control will improve Ability to identify and develop effective coping behaviors will improve Ability to maintain clinical measurements within normal limits will improve Compliance with prescribed medications will improve Ability to identify triggers associated with substance abuse/mental health issues will improve     Medication Management: Evaluate patient's response, side effects, and tolerance of medication regimen.  Therapeutic Interventions: 1 to 1 sessions, Unit Group sessions and Medication administration.  Evaluation of Outcomes: Not Met   RN Treatment Plan for Primary Diagnosis: Schizoaffective disorder, bipolar type ([place]) Long Term Goal(s): Knowledge of disease and therapeutic regimen to maintain health will improve  Short Term Goals: Ability to demonstrate self-control, Ability to verbalize feelings will improve and Compliance with prescribed medications will improve  Medication Management: RN will administer medications as ordered by provider, will assess and evaluate patient's response and provide education to  patient for prescribed medication. RN will report any adverse and/or side effects to prescribing provider.  Therapeutic Interventions: 1 on 1 counseling sessions, Psychoeducation, Medication administration, Evaluate responses to treatment, Monitor vital signs and CBGs as ordered, Perform/monitor CIWA, COWS, AIMS and Fall Risk screenings as ordered, Perform wound care treatments as ordered.  Evaluation of Outcomes: Not Met   LCSW Treatment Plan for Primary Diagnosis: Schizoaffective disorder, bipolar type ([place]) Long Term Goal(s): Safe transition to appropriate next level of care at discharge, Engage patient in therapeutic group addressing interpersonal concerns.  Short Term Goals: Engage patient in aftercare planning with referrals and resources, Increase ability to appropriately verbalize feelings, Increase emotional regulation and Facilitate patient progression through stages of change regarding substance use diagnoses and concerns  Therapeutic Interventions: Assess for all discharge needs, 1 to 1 time with Social worker, Explore available resources and support systems, Assess for adequacy in community support network, Educate family and significant other(s) on suicide prevention, Complete Psychosocial Assessment, Interpersonal group therapy.  Evaluation of Outcomes: Not Met   Progress in Treatment: Attending groups: No. Participating in groups: No. Taking medication as prescribed: Yes. Toleration medication: Yes. Family/Significant other contact made: No, will contact:  CSW declined consents Patient understands diagnosis: Yes. Discussing patient identified problems/goals with staff: Yes. Medical problems stabilized or resolved: Yes. Denies suicidal/homicidal ideation: Yes. Issues/concerns per patient self-inventory: Yes. Other:  New problem(s) identified: No, Describe:  CSW will continue to assess  New Short Term/Long Term Goal(s):medication stabilization, elimination of SI  thoughts, development of comprehensive mental wellness plan.  Patient Goals:  "medications"  Discharge Plan or Barriers: Patient recently admitted. CSW will continue to follow and assess for appropriate referrals and possible discharge planning.  Reason for Continuation of Hospitalization: Depression Suicidal ideation  Estimated Length of Stay: 3-5 days  Attendees: Patient: Jeffrey Michael 10/02/2020 11:09 AM  Physician: Dr. Mallie Darting 10/02/2020 11:09 AM  Nursing:  10/02/2020 11:09 AM  RN Care Manager: 10/02/2020 11:09 AM  Social Worker: Toney Reil, [place] 10/02/2020 11:09 AM  Recreational Therapist:  10/02/2020 11:09 AM  Other:  10/02/2020 11:09 AM  Other:  10/02/2020 11:09 AM  Other: 10/02/2020 11:09 AM    Scribe for Treatment Team: Mliss Fritz, [place] 10/02/2020 11:09 AM

## 2020-10-03 MED ORDER — TRAZODONE HCL 100 MG PO TABS: 100.0000 mg | ORAL_TABLET | Freq: Every evening | ORAL | 0 refills | Status: DC | PRN

## 2020-10-03 MED ORDER — BENZTROPINE MESYLATE 1 MG PO TABS: 1.0000 mg | ORAL_TABLET | Freq: Two times a day (BID) | ORAL | 0 refills | Status: DC

## 2020-10-03 MED ORDER — HALOPERIDOL DECANOATE 100 MG/ML IM SOLN
50.0000 mg | Freq: Once | INTRAMUSCULAR | Status: AC
  Administered 2020-10-03: 50 mg via INTRAMUSCULAR
  Filled 2020-10-03: qty 0.5

## 2020-10-03 MED ORDER — FOLIC ACID 1 MG PO TABS: 1.0000 mg | ORAL_TABLET | Freq: Every day | ORAL | 0 refills | Status: DC

## 2020-10-03 MED ORDER — HALOPERIDOL 10 MG PO TABS: 10.0000 mg | ORAL_TABLET | Freq: Two times a day (BID) | ORAL | 0 refills | Status: DC

## 2020-10-03 MED ORDER — THIAMINE HCL 100 MG PO TABS
100.0000 mg | ORAL_TABLET | Freq: Every day | ORAL | 0 refills | Status: DC
Start: 2020-10-04 — End: 2021-10-24

## 2020-10-03 NOTE — Progress Notes (Signed)
Pt been sleep much of the shift    10/03/20 0600  Psych Admission Type (Psych Patients Only)  Admission Status Involuntary  Psychosocial Assessment  Patient Complaints None  Eye Contact Fair  Facial Expression Anxious  Affect Anxious  Speech Logical/coherent  Interaction Assertive  Motor Activity Slow  Appearance/Hygiene Disheveled  Thought Process  Coherency Circumstantial  Content Paranoia  Delusions Paranoid  Perception UTA  Hallucination None reported or observed  Judgment Poor  Confusion WDL  Danger to Self  Current suicidal ideation? Denies  Danger to Others  Danger to Others None reported or observed

## 2020-10-03 NOTE — Progress Notes (Signed)
Pt d/c from the hospital. All items returned. D/C instructions given along with MD instructions to f/u with monthly haldol injection. Bus passes given. Pt acknowledges understanding. Pt denies si and hi.
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
## 2020-10-03 NOTE — [facility]HH Suicide Risk Assessment (Signed)
[facility] Discharge Suicide Risk Assessment   Principal Problem: Schizoaffective disorder, bipolar type [facility]) Discharge Diagnoses: Principal Problem:   Schizoaffective disorder, bipolar type (HCC) Active Problems:   Polysubstance abuse (HCC)   Homelessness   Total Time spent with patient: 30 minutes  Musculoskeletal: Strength & Muscle Tone: within normal limits Gait & Station: normal Patient leans: N/A  Psychiatric Specialty Exam: Review of Systems  All other systems reviewed and are negative.   Blood pressure 131/78, pulse (!) 103, temperature 97.9 F (36.6 C), temperature source Oral, resp. rate 16, height 5\' 11"  (1.803 m), weight 66.5 kg, SpO2 99 %.Body mass index is 20.43 kg/m.  General Appearance: Casual  Eye Contact::  Fair  Speech:  Normal Rate409  Volume:  Normal  Mood:  Euthymic  Affect:  Congruent  Thought Process:  Coherent and Descriptions of Associations: Loose  Orientation:  Full (Time, Place, and Person)  Thought Content:  Delusions and Paranoid Ideation  Suicidal Thoughts:  No  Homicidal Thoughts:  No  Memory:  Immediate;   Fair Recent;   Fair Remote;   Fair  Judgement:  Intact  Insight:  Fair  Psychomotor Activity:  Normal  Concentration:  Fair  Recall:  [id number] of Knowledge:Fair  Language: Good  Akathisia:  Negative  Handed:  Right  AIMS (if indicated):     Assets:  Desire for Improvement Resilience  Sleep:  Number of Hours: 8.25  Cognition: WNL  ADL's:  Intact   Mental Status Per Nursing Assessment::   On Admission:  NA  Demographic Factors:  Male, Low socioeconomic status, Living alone and Unemployed  Loss Factors: Legal issues  Historical Factors: Impulsivity  Risk Reduction Factors:   NA  Continued Clinical Symptoms:  Schizophrenia:   Less than 65 years old Paranoid or undifferentiated type  Cognitive Features That Contribute To Risk:  None    Suicide Risk:  Minimal: No identifiable suicidal ideation.  Patients  presenting with no risk factors but with morbid ruminations; may be classified as minimal risk based on the severity of the depressive symptoms   Follow-up Information    [facility]. Go on 10/13/2020.   Specialty: Behavioral Health Why: You have a walk in appointment for therapy services on 10/13/20 at 12:30 pm.   You also have an appointment for medication management on 11/03/20 at 11:30 am.  These appointments will be held in person. Contact information: [address] [place] [phone]              Plan Of Care/Follow-up recommendations:  Activity:  ad lib  [phone], MD 10/03/2020, 10:06 AM

## 2020-10-03 NOTE — Progress Notes (Signed)
  [facility] Adult Case Management Discharge Plan :  Will you be returning to the same living situation after discharge:  No. Will be discharged to brothers house. At discharge, do you have transportation home?: No. Bus passes to be provided Do you have the ability to pay for your medications: Yes,  has insurance   Release of information consent forms completed and in the chart;  Patient's signature needed at discharge.  Patient to Follow up at:  Follow-up Information    [facility] Go on 10/13/2020.   Specialty: Behavioral Health Why: You have a walk in appointment for therapy services on 10/13/20 at 12:30 pm.   You also have an appointment for medication management on 11/03/20 at 11:30 am.  These appointments will be held in person. Contact information: [address] [place] [address] [phone]              Next level of care provider has access to [facility] Link:yes  Safety Planning and Suicide Prevention discussed: Yes,  with patient     Has patient been referred to the Quitline?: Patient refused referral  Patient has been referred for addiction treatment: Pt. refused referral  Otelia Santee, LCSW 10/03/2020, 10:21 AM

## 2020-11-03 ENCOUNTER — Encounter (HOSPITAL_COMMUNITY): Payer: 59 | Admitting: Physician Assistant

## 2020-12-23 ENCOUNTER — Other Ambulatory Visit: Payer: Self-pay

## 2020-12-23 ENCOUNTER — Emergency Department (HOSPITAL_COMMUNITY)
Admission: EM | Admit: 2020-12-23 | Discharge: 2020-12-23 | Disposition: A | Discharge status: Home / Self Care | Payer: 59 | Attending: Emergency Medicine | Admitting: Emergency Medicine

## 2020-12-23 ENCOUNTER — Encounter (HOSPITAL_COMMUNITY): Payer: Self-pay | Admitting: *Deleted

## 2020-12-23 DIAGNOSIS — F17210 Nicotine dependence, cigarettes, uncomplicated: Secondary | ICD-10-CM | POA: Insufficient documentation

## 2020-12-23 DIAGNOSIS — Z760 Encounter for issue of repeat prescription: Secondary | ICD-10-CM | POA: Diagnosis not present

## 2020-12-23 MED ORDER — TRAZODONE HCL 100 MG PO TABS: 100.0000 mg | ORAL_TABLET | Freq: Every day | ORAL | 0 refills | Status: DC

## 2020-12-23 MED ORDER — TRAZODONE HCL 50 MG PO TABS
100.0000 mg | ORAL_TABLET | Freq: Once | ORAL | Status: AC
  Administered 2020-12-23: 100 mg via ORAL
  Filled 2020-12-23: qty 2

## 2020-12-23 MED ORDER — HALOPERIDOL 5 MG PO TABS
10.0000 mg | ORAL_TABLET | Freq: Once | ORAL | Status: AC
  Administered 2020-12-23: 10 mg via ORAL
  Filled 2020-12-23: qty 2

## 2020-12-23 MED ORDER — BENZTROPINE MESYLATE 1 MG PO TABS
1.0000 mg | ORAL_TABLET | Freq: Once | ORAL | Status: AC
  Administered 2020-12-23: 1 mg via ORAL
  Filled 2020-12-23: qty 1

## 2020-12-23 MED ORDER — BENZTROPINE MESYLATE 1 MG PO TABS: 1.0000 mg | ORAL_TABLET | Freq: Two times a day (BID) | ORAL | 0 refills | Status: DC

## 2020-12-23 MED ORDER — HALOPERIDOL 10 MG PO TABS: 10.0000 mg | ORAL_TABLET | Freq: Two times a day (BID) | ORAL | 0 refills | Status: DC

## 2020-12-23 MED ORDER — TRAZODONE HCL 50 MG PO TABS: 100.0000 mg | ORAL_TABLET | Freq: Every day | ORAL | Status: DC

## 2020-12-23 NOTE — ED Triage Notes (Signed)
Patient states he is homeless walked her tonight for medication refill of which he has been out of his medicine for months, "states he thought he would just stop by tonight and get it. States he has schizophrenia.

## 2020-12-23 NOTE — ED Provider Notes (Signed)
[facility] EMERGENCY DEPARTMENT Provider Note   CSN: [id number] Arrival date & time: 12/23/20  0423     History Chief Complaint  Patient presents with  . Medical Clearance    Jeffrey Michael is a 35 y.o. male.  The history is provided by the patient and medical records.   35 year old male with history of bipolar disorder, depression, schizophrenia, presenting to the ED requesting assistance with medications.  States he has been off his medications for few months now.  States he was doing okay but recently has started having some trouble sleeping.  States he feels like his mind cannot relax.  He denies any auditory or visual hallucinations.  No suicidal or homicidal ideation.  States when taking medications, he felt more "regulated".  States he has not had any recent psychiatry follow-up because the clinic he previously went to no longer accepts his insurance.  Past Medical History:  Diagnosis Date  . Bipolar 1 disorder (HCC)   . Depression   . Schizophrenia [facility])     Patient Active Problem List   Diagnosis Date Noted  . Schizoaffective disorder, bipolar type (HCC) 10/01/2020  . Polysubstance abuse (HCC) 10/01/2020  . Homelessness 10/01/2020  . Bipolar I disorder (HCC) 02/24/2020  . Bipolar 1 disorder (HCC) 02/24/2020  . Psychosis (HCC) 02/24/2020  . Schizophrenia (HCC) 02/24/2020    History reviewed. No pertinent surgical history.     No family history on file.  Social History   Tobacco Use  . Smoking status: Current Every Day Smoker    Types: Cigarettes  . Smokeless tobacco: Never Used  Substance Use Topics  . Alcohol use: Yes    Comment: denies to this RN  . Drug use: Yes    Types: Marijuana    Home Medications Prior to Admission medications   Medication Sig Start Date End Date Taking? Authorizing Provider  benztropine (COGENTIN) 1 MG tablet Take 1 tablet (1 mg total) by mouth 2 (two) times daily. 10/03/20   Antonieta Pert, MD  folic  acid (FOLVITE) 1 MG tablet Take 1 tablet (1 mg total) by mouth daily. 10/04/20   Antonieta Pert, MD  haloperidol (HALDOL) 10 MG tablet Take 1 tablet (10 mg total) by mouth 2 (two) times daily. 10/03/20   Antonieta Pert, MD  thiamine 100 MG tablet Take 1 tablet (100 mg total) by mouth daily. 10/04/20   Antonieta Pert, MD  traZODone (DESYREL) 100 MG tablet Take 1 tablet (100 mg total) by mouth at bedtime and may repeat dose one time if needed. 10/03/20   Antonieta Pert, MD    Allergies    Citrus  Review of Systems   Review of Systems  Psychiatric/Behavioral: Positive for sleep disturbance.  All other systems reviewed and are negative.   Physical Exam Updated Vital Signs BP (!) 142/85 (BP Location: Left Arm)   Pulse (!) 110   Temp 98.3 F (36.8 C) (Oral)   Resp 20   Ht 5\' 11"  (1.803 m)   Wt 65.2 kg   SpO2 100%   BMI 20.05 kg/m   Physical Exam Vitals and nursing note reviewed.  Constitutional:      Appearance: He is well-developed and well-nourished.  HENT:     Head: Normocephalic and atraumatic.     Mouth/Throat:     Mouth: Oropharynx is clear and moist.  Eyes:     Extraocular Movements: EOM normal.     Conjunctiva/sclera: Conjunctivae normal.  Pupils: Pupils are equal, round, and reactive to light.  Cardiovascular:     Rate and Rhythm: Normal rate and regular rhythm.     Heart sounds: Normal heart sounds.  Pulmonary:     Effort: Pulmonary effort is normal.     Breath sounds: Normal breath sounds.  Abdominal:     General: Bowel sounds are normal.     Palpations: Abdomen is soft.  Musculoskeletal:        General: Normal range of motion.     Cervical back: Normal range of motion.  Skin:    General: Skin is warm and dry.  Neurological:     Mental Status: He is alert and oriented to person, place, and time.  Psychiatric:        Mood and Affect: Mood and affect normal.     Comments: Denies SI/HI/AVH, not responding to internal stimuli, normal  mood/affect, good eye contact     ED Results / Procedures / Treatments   Labs (all labs ordered are listed, but only abnormal results are displayed) Labs Reviewed - No data to display  EKG None  Radiology No results found.  Procedures Procedures (including critical care time)  Medications Ordered in ED Medications  benztropine (COGENTIN) tablet 1 mg (1 mg Oral Given 12/23/20 0513)  haloperidol (HALDOL) tablet 10 mg (10 mg Oral Given 12/23/20 0513)  traZODone (DESYREL) tablet 100 mg (100 mg Oral Given 12/23/20 1884)    ED Course  I have reviewed the triage vital signs and the nursing notes.  Pertinent labs & imaging results that were available during my care of the patient were reviewed by me and considered in my medical decision making (see chart for details).    MDM Rules/Calculators/A&P  35 year old male presenting to the ED requesting refill of his medications.  He has been off of them for several months and was initially doing well but reports difficulty sleeping over the past few days.  He is awake, alert, oriented.  He denies any suicidal homicidal ideation.  No hallucinations.  He does not appear to be responding to internal stimuli.  Normal mood and affect, making good eye contact.  Do not feel he needs emergent psychiatric consultation at this time.  Dose of home meds given, refill for same.  Has had some difficulty having follow-up with his prior psychiatrist as he reports the clinic no longer accepts his insurance.  He was given resource guide for local clinics as well as information for [facility].  May return here for any new/acute changes.  Final Clinical Impression(s) / ED Diagnoses Final diagnoses:  Medication refill    Rx / DC Orders ED Discharge Orders         Ordered    benztropine (COGENTIN) 1 MG tablet  2 times daily        12/23/20 0529    haloperidol (HALDOL) 10 MG tablet  2 times daily        12/23/20 0529    traZODone (DESYREL) 100 MG tablet  Daily at  bedtime        12/23/20 0529           Garlon Hatchet, PA-C 12/23/20 1660    Gilda Crease, MD 12/23/20 [phone]

## 2020-12-23 NOTE — Discharge Instructions (Signed)
Refills of your medications have been sent to your pharmacy. Please take as directed. Follow-up with psychiatry. I have attached a list of local clinics as well as the behavioral health urgent care that is open 24/7. Return here for new concerns.

## 2020-12-23 NOTE — ED Notes (Signed)
E-signature pad unavailable at time of pt discharge. This RN discussed discharge materials with pt and answered all pt questions. Pt stated understanding of discharge material. ? ?

## 2020-12-23 NOTE — ED Notes (Signed)
Sandwich bag given to pt.  °

## 2020-12-25 ENCOUNTER — Encounter (HOSPITAL_COMMUNITY): Payer: Self-pay | Admitting: Emergency Medicine

## 2020-12-25 ENCOUNTER — Emergency Department (HOSPITAL_COMMUNITY)
Admission: EM | Admit: 2020-12-25 | Discharge: 2020-12-25 | Disposition: A | Discharge status: Home / Self Care | Payer: 59 | Attending: Emergency Medicine | Admitting: Emergency Medicine

## 2020-12-25 DIAGNOSIS — Z5900 Homelessness unspecified: Secondary | ICD-10-CM | POA: Diagnosis not present

## 2020-12-25 DIAGNOSIS — Z760 Encounter for issue of repeat prescription: Secondary | ICD-10-CM | POA: Insufficient documentation

## 2020-12-25 DIAGNOSIS — Z5321 Procedure and treatment not carried out due to patient leaving prior to being seen by health care provider: Secondary | ICD-10-CM | POA: Diagnosis not present

## 2020-12-25 NOTE — ED Notes (Signed)
Called for vitals and no response 

## 2020-12-25 NOTE — ED Triage Notes (Signed)
Pt backl tonight stating that he is homeless and wants to make sure that the meds we gave him are working , no si or hi

## 2021-07-17 ENCOUNTER — Encounter (HOSPITAL_COMMUNITY): Payer: Self-pay | Admitting: Emergency Medicine

## 2021-07-17 ENCOUNTER — Emergency Department (HOSPITAL_COMMUNITY)
Admission: EM | Admit: 2021-07-17 | Discharge: 2021-07-17 | Disposition: A | Discharge status: Home / Self Care | Payer: Medicare Other | Attending: Emergency Medicine | Admitting: Emergency Medicine

## 2021-07-17 ENCOUNTER — Other Ambulatory Visit: Payer: Self-pay

## 2021-07-17 DIAGNOSIS — Z748 Other problems related to care provider dependency: Secondary | ICD-10-CM | POA: Diagnosis not present

## 2021-07-17 DIAGNOSIS — Z5900 Homelessness unspecified: Secondary | ICD-10-CM | POA: Diagnosis not present

## 2021-07-17 DIAGNOSIS — R440 Auditory hallucinations: Secondary | ICD-10-CM | POA: Diagnosis present

## 2021-07-17 DIAGNOSIS — F17210 Nicotine dependence, cigarettes, uncomplicated: Secondary | ICD-10-CM | POA: Insufficient documentation

## 2021-07-17 DIAGNOSIS — Z760 Encounter for issue of repeat prescription: Secondary | ICD-10-CM | POA: Insufficient documentation

## 2021-07-17 DIAGNOSIS — Z789 Other specified health status: Secondary | ICD-10-CM

## 2021-07-17 LAB — URINALYSIS, ROUTINE W REFLEX MICROSCOPIC
Bilirubin Urine: NEGATIVE
Glucose, UA: NEGATIVE mg/dL
Hgb urine dipstick: NEGATIVE
Ketones, ur: NEGATIVE mg/dL
Leukocytes,Ua: NEGATIVE
Nitrite: NEGATIVE
Protein, ur: NEGATIVE mg/dL
Specific Gravity, Urine: 1.023 (ref 1.005–1.030)
pH: 7 (ref 5.0–8.0)

## 2021-07-17 LAB — GC/CHLAMYDIA PROBE AMP (~~LOC~~) NOT AT ARMC
Chlamydia: NEGATIVE
Comment: NEGATIVE
Comment: NORMAL
Neisseria Gonorrhea: NEGATIVE

## 2021-07-17 NOTE — Discharge Instructions (Addendum)
Thank you for allowing me to care for you today in the Emergency Department.   He has a attached resources for follow-up.  You can follow-up with [facility] and they can also provide you medication refills when you run out of your current medications.  Return to the emergency department if you have thoughts of wanting to harm yourself or others, if you start having fever, severe abdominal pain, blood in your urine, or other new, concerning symptoms.

## 2021-07-17 NOTE — ED Provider Notes (Signed)
[facility] EMERGENCY DEPARTMENT Provider Note   CSN: [id number] Arrival date & time: 07/17/21  0150     History Chief Complaint  Patient presents with   Exposure to STD    Jeffrey Michael is a 35 y.o. male with a history of polysubstance use disorder, homelessness, bipolar 1 disorder, schizophrenia who presents to the emergency department with multiple complaints.    He is requesting refills of his home medication.  However, when asked about the dosages and the medications he takes, he pulled out pill bottles full of all of the medications.  He also states that he needs to have "his penis and his mouth washed".  He states that he has been having sex with men and needs to to have them cleaned.  He initially denies pain, but later states that he is having dysuria.  No penile discharge, penile or testicular swelling, fever, chills, nausea, vomiting, diarrhea.  Patient reports that he was incarcerated and was recently released from jail.  He has not established with psychiatry.  Reports that he has been hearing voices that whisper to him, but denies kill commands.  No visual hallucinations, SI, or HI.  The history is provided by the patient and medical records. No language interpreter was used.      Past Medical History:  Diagnosis Date   Bipolar 1 disorder (HCC)    Depression    Schizophrenia [facility])     Patient Active Problem List   Diagnosis Date Noted   Schizoaffective disorder, bipolar type (HCC) 10/01/2020   Polysubstance abuse (HCC) 10/01/2020   Homelessness 10/01/2020   Bipolar I disorder (HCC) 02/24/2020   Bipolar 1 disorder (HCC) 02/24/2020   Psychosis (HCC) 02/24/2020   Schizophrenia (HCC) 02/24/2020    History reviewed. No pertinent surgical history.     No family history on file.  Social History   Tobacco Use   Smoking status: Every Day    Types: Cigarettes   Smokeless tobacco: Never  Substance Use Topics   Alcohol use: Yes     Comment: denies to this RN   Drug use: Yes    Types: Marijuana    Home Medications Prior to Admission medications   Medication Sig Start Date End Date Taking? Authorizing Provider  benztropine (COGENTIN) 1 MG tablet Take 1 tablet (1 mg total) by mouth 2 (two) times daily. 12/23/20   Garlon Hatchet, PA-C  folic acid (FOLVITE) 1 MG tablet Take 1 tablet (1 mg total) by mouth daily. 10/04/20   Antonieta Pert, MD  haloperidol (HALDOL) 10 MG tablet Take 1 tablet (10 mg total) by mouth 2 (two) times daily. 12/23/20   Garlon Hatchet, PA-C  thiamine 100 MG tablet Take 1 tablet (100 mg total) by mouth daily. 10/04/20   Antonieta Pert, MD  traZODone (DESYREL) 100 MG tablet Take 1 tablet (100 mg total) by mouth at bedtime. 12/23/20   Garlon Hatchet, PA-C    Allergies    Citrus  Review of Systems   Review of Systems  Constitutional:  Negative for appetite change, chills, diaphoresis and fever.  Respiratory:  Negative for shortness of breath.   Cardiovascular:  Negative for chest pain and palpitations.  Gastrointestinal:  Negative for abdominal pain, diarrhea, nausea and vomiting.  Genitourinary:  Positive for dysuria. Negative for hematuria, penile pain, scrotal swelling and urgency.  Musculoskeletal:  Negative for back pain, joint swelling, myalgias and neck stiffness.  Skin:  Negative for rash.  Allergic/Immunologic: Negative  for immunocompromised state.  Neurological:  Negative for dizziness, seizures, syncope, weakness, light-headedness, numbness and headaches.  Psychiatric/Behavioral:  Negative for confusion.    Physical Exam Updated Vital Signs BP (!) 155/96 (BP Location: Right Arm)   Pulse 79   Temp 98.5 F (36.9 C) (Oral)   Resp 17   SpO2 99%   Physical Exam Vitals and nursing note reviewed.  Constitutional:      General: He is not in acute distress.    Appearance: He is well-developed. He is not ill-appearing, toxic-appearing or diaphoretic.  HENT:     Head:  Normocephalic.  Eyes:     Conjunctiva/sclera: Conjunctivae normal.  Cardiovascular:     Rate and Rhythm: Normal rate and regular rhythm.     Heart sounds: No murmur heard. Pulmonary:     Effort: Pulmonary effort is normal. No respiratory distress.     Breath sounds: No stridor. No wheezing, rhonchi or rales.  Chest:     Chest wall: No tenderness.  Abdominal:     General: There is no distension.     Palpations: Abdomen is soft. There is no mass.     Tenderness: There is no abdominal tenderness. There is no right CVA tenderness, left CVA tenderness, guarding or rebound.     Hernia: No hernia is present.  Musculoskeletal:     Cervical back: Neck supple.     Right lower leg: No edema.     Left lower leg: No edema.  Skin:    General: Skin is warm and dry.  Neurological:     Mental Status: He is alert.  Psychiatric:        Attention and Perception: He perceives auditory hallucinations. He does not perceive visual hallucinations.        Speech: Speech normal.        Behavior: Behavior normal.        Thought Content: Thought content does not include homicidal or suicidal ideation. Thought content does not include homicidal or suicidal plan.    ED Results / Procedures / Treatments   Labs (all labs ordered are listed, but only abnormal results are displayed) Labs Reviewed  URINALYSIS, ROUTINE W REFLEX MICROSCOPIC  GC/CHLAMYDIA PROBE AMP ([place]) NOT AT [facility]    EKG None  Radiology No results found.  Procedures Procedures   Medications Ordered in ED Medications - No data to display  ED Course  I have reviewed the triage vital signs and the nursing notes.  Pertinent labs & imaging results that were available during my care of the patient were reviewed by me and considered in my medical decision making (see chart for details).    MDM Rules/Calculators/A&P                           35 year old male with a history of polysubstance use disorder, homelessness, bipolar  1 disorder, schizophrenia who presents to the emergency department with a chief complaint of medication refill.  He is requesting refills of his home meds, but when asked about his dosing and medications, he pulls out pill bottles containing all of his home medications that are not empty.  No indication for medication refill at this time.  However, I will refer him to [facility] to get established with psychiatry for future refill needs.  He also adds that he has been having some burning with urination.  He is requesting to have his penis and his mouth washed out as he has  been having sex with new male partners.  Urinalysis was obtained patient and is unremarkable.  GC and chlamydia has been sent.  No other complaints at this time.  He has been given shelter resources within the community.  All questions answered.  Doubt pyelonephritis, sepsis, testicular torsion, epididymitis, psychosis, or emergent psychologic condition.  He is hemodynamically stable in no acute distress.  Safer discharge home with outpatient follow-up as discussed.  Final Clinical Impression(s) / ED Diagnoses Final diagnoses:  Homelessness  Encounter for medication refill  Need for community resource    Rx / DC Orders ED Discharge Orders     None        Barkley Boards, PA-C 07/17/21 0419    Glynn Octave, MD 07/17/21 [phone]

## 2021-07-17 NOTE — ED Triage Notes (Signed)
Pt states he needs to be checked b/c he "has been having sex w/ men and need to be flushed out and checked."  Denies any pain or symptoms at this time. States he just got out of prison "a few weeks ago."

## 2021-10-24 ENCOUNTER — Encounter (HOSPITAL_COMMUNITY): Payer: Self-pay | Admitting: Emergency Medicine

## 2021-10-24 ENCOUNTER — Other Ambulatory Visit: Payer: Self-pay

## 2021-10-24 ENCOUNTER — Emergency Department (HOSPITAL_COMMUNITY)
Admission: EM | Admit: 2021-10-24 | Discharge: 2021-10-24 | Disposition: A | Discharge status: Home / Self Care | Payer: Medicare Other | Attending: Physician Assistant | Admitting: Physician Assistant

## 2021-10-24 DIAGNOSIS — F17210 Nicotine dependence, cigarettes, uncomplicated: Secondary | ICD-10-CM | POA: Insufficient documentation

## 2021-10-24 DIAGNOSIS — Z760 Encounter for issue of repeat prescription: Secondary | ICD-10-CM | POA: Insufficient documentation

## 2021-10-24 MED ORDER — FOLIC ACID 1 MG PO TABS: 1.0000 mg | ORAL_TABLET | Freq: Every day | ORAL | 0 refills | Status: AC

## 2021-10-24 MED ORDER — BENZTROPINE MESYLATE 1 MG PO TABS: 1.0000 mg | ORAL_TABLET | Freq: Two times a day (BID) | ORAL | 0 refills | Status: DC

## 2021-10-24 MED ORDER — TRAZODONE HCL 100 MG PO TABS: 100.0000 mg | ORAL_TABLET | Freq: Every day | ORAL | 0 refills | Status: AC

## 2021-10-24 MED ORDER — HALOPERIDOL 10 MG PO TABS: 10.0000 mg | ORAL_TABLET | Freq: Two times a day (BID) | ORAL | 0 refills | Status: AC

## 2021-10-24 MED ORDER — THIAMINE HCL 100 MG PO TABS: 100.0000 mg | ORAL_TABLET | Freq: Every day | ORAL | 0 refills | Status: AC

## 2021-10-24 NOTE — ED Provider Notes (Signed)
[facility] EMERGENCY DEPARTMENT Provider Note   CSN: [id number] Arrival date & time: 10/24/21  1253     History Chief Complaint  Patient presents with   Medication Refill    Jeffrey Michael is a 35 y.o. male.  HPI  35 year old male with history of bipolar 1 disorder, depression, schizophrenia, who presents to the emergency department today for a medication refill.  He states that he has not been out of medications for a while but he is currently on parole and he is being required to restart taking his medications.  He denies any psychiatrical medical problems at this time including no SI, HI, AVH.  Past Medical History:  Diagnosis Date   Bipolar 1 disorder (HCC)    Depression    Schizophrenia [facility])     Patient Active Problem List   Diagnosis Date Noted   Schizoaffective disorder, bipolar type (HCC) 10/01/2020   Polysubstance abuse (HCC) 10/01/2020   Homelessness 10/01/2020   Bipolar I disorder (HCC) 02/24/2020   Bipolar 1 disorder (HCC) 02/24/2020   Psychosis (HCC) 02/24/2020   Schizophrenia (HCC) 02/24/2020    History reviewed. No pertinent surgical history.     No family history on file.  Social History   Tobacco Use   Smoking status: Every Day    Types: Cigarettes   Smokeless tobacco: Never  Substance Use Topics   Alcohol use: Yes    Comment: denies to this RN   Drug use: Yes    Types: Marijuana    Home Medications Prior to Admission medications   Medication Sig Start Date End Date Taking? Authorizing Provider  benztropine (COGENTIN) 1 MG tablet Take 1 tablet (1 mg total) by mouth 2 (two) times daily. 10/24/21   Edgardo Petrenko S, PA-C  folic acid (FOLVITE) 1 MG tablet Take 1 tablet (1 mg total) by mouth daily. 10/24/21   Layali Freund S, PA-C  haloperidol (HALDOL) 10 MG tablet Take 1 tablet (10 mg total) by mouth 2 (two) times daily. 10/24/21   Imunique Samad S, PA-C  thiamine 100 MG tablet Take 1 tablet (100 mg total) by mouth  daily. 10/24/21   Jamire Shabazz S, PA-C  traZODone (DESYREL) 100 MG tablet Take 1 tablet (100 mg total) by mouth at bedtime. 10/24/21   Annaliyah Willig S, PA-C    Allergies    Citrus  Review of Systems   Review of Systems  Constitutional:  Negative for fever.  Respiratory:  Negative for shortness of breath.   Cardiovascular:  Negative for chest pain.  Gastrointestinal:  Negative for abdominal pain and diarrhea.  Musculoskeletal:  Negative for back pain.  Neurological:  Negative for headaches.  Psychiatric/Behavioral:         No SI, HI or AVH   Physical Exam Updated Vital Signs BP 126/66 (BP Location: Right Arm)   Pulse (!) 107   Temp 98.3 F (36.8 C) (Oral)   Resp 18   SpO2 98%   Physical Exam Constitutional:      General: He is not in acute distress.    Appearance: He is well-developed.  Eyes:     Conjunctiva/sclera: Conjunctivae normal.  Cardiovascular:     Rate and Rhythm: Normal rate.  Pulmonary:     Effort: Pulmonary effort is normal.  Skin:    General: Skin is warm and dry.  Neurological:     Mental Status: He is alert and oriented to person, place, and time.  Psychiatric:  Thought Content: Thought content does not include homicidal or suicidal ideation. Thought content does not include homicidal or suicidal plan.    ED Results / Procedures / Treatments   Labs (all labs ordered are listed, but only abnormal results are displayed) Labs Reviewed - No data to display  EKG None  Radiology No results found.  Procedures Procedures   Medications Ordered in ED Medications - No data to display  ED Course  I have reviewed the triage vital signs and the nursing notes.  Pertinent labs & imaging results that were available during my care of the patient were reviewed by me and considered in my medical decision making (see chart for details).    MDM Rules/Calculators/A&P                         Pt here for med refill to comply with his parole  rules. Denies medical or psychiatric complaints. Meds filled. Resources given.   Final Clinical Impression(s) / ED Diagnoses Final diagnoses:  Medication refill    Rx / DC Orders ED Discharge Orders          Ordered    benztropine (COGENTIN) 1 MG tablet  2 times daily        10/24/21 1341    folic acid (FOLVITE) 1 MG tablet  Daily        10/24/21 1341    haloperidol (HALDOL) 10 MG tablet  2 times daily        10/24/21 1341    thiamine 100 MG tablet  Daily        10/24/21 1341    traZODone (DESYREL) 100 MG tablet  Daily at bedtime        10/24/21 1341             Enrique Manganaro S, PA-C 10/24/21 1344    Horton, Clabe Seal, DO 10/25/21 2249

## 2021-10-24 NOTE — Discharge Instructions (Addendum)
Take medications as prescribed.  Follow up with behavioral health urgent care as directed   Please follow up with your primary care provider within 5-7 days for re-evaluation of your symptoms. If you do not have a primary care provider, information for a healthcare clinic has been provided for you to make arrangements for follow up care. Please return to the emergency department for any new or worsening symptoms.

## 2021-10-24 NOTE — ED Triage Notes (Signed)
Patient states he needs refill on his schizophrenia medication.

## 2021-12-30 ENCOUNTER — Other Ambulatory Visit: Payer: Self-pay

## 2021-12-30 ENCOUNTER — Emergency Department (HOSPITAL_COMMUNITY)
Admission: EM | Admit: 2021-12-30 | Discharge: 2021-12-31 | Disposition: A | Discharge status: Home / Self Care | Payer: Medicare Other | Attending: Emergency Medicine | Admitting: Emergency Medicine

## 2021-12-30 ENCOUNTER — Encounter (HOSPITAL_COMMUNITY): Payer: Self-pay

## 2021-12-30 DIAGNOSIS — Z760 Encounter for issue of repeat prescription: Secondary | ICD-10-CM | POA: Diagnosis not present

## 2021-12-30 NOTE — ED Triage Notes (Signed)
[place] [facility] with complaints of needing his medication for his schizophrenia. Pt takes Cogentin and has been out for several weeks. Pt denies other complaints.

## 2021-12-31 ENCOUNTER — Encounter (HOSPITAL_COMMUNITY): Payer: Self-pay | Admitting: Student

## 2021-12-31 DIAGNOSIS — Z760 Encounter for issue of repeat prescription: Secondary | ICD-10-CM | POA: Diagnosis not present

## 2021-12-31 MED ORDER — BENZTROPINE MESYLATE 1 MG PO TABS
1.0000 mg | ORAL_TABLET | Freq: Once | ORAL | Status: AC
  Administered 2021-12-31: 1 mg via ORAL
  Filled 2021-12-31: qty 1

## 2021-12-31 MED ORDER — BENZTROPINE MESYLATE 1 MG PO TABS: 1.0000 mg | ORAL_TABLET | Freq: Two times a day (BID) | ORAL | 0 refills | Status: AC

## 2021-12-31 NOTE — Discharge Instructions (Addendum)
Please take your cogentin as prescribed.  See attached resources for shelter

## 2021-12-31 NOTE — ED Provider Notes (Signed)
[facility] EMERGENCY DEPARTMENT Provider Note   CSN: [id number] Arrival date & time: 12/30/21  2244     History  Chief Complaint  Patient presents with   Medication Refill    Jeffrey Michael is a 36 y.o. male with a hx of bipolar 1 disorder, depression, schizophrenia and homelessness who presents to the ED requesting medication refill of cogentin which he takes for his schizophrenia. He also notes he is homeless and that it was cold/raining therefore he wanted a place to stay. No alleviating/aggravating factors. No other complaints at this time.   HPI     Home Medications Prior to Admission medications   Medication Sig Start Date End Date Taking? Authorizing Provider  benztropine (COGENTIN) 1 MG tablet Take 1 tablet (1 mg total) by mouth 2 (two) times daily. 10/24/21   Couture, Cortni S, PA-C  folic acid (FOLVITE) 1 MG tablet Take 1 tablet (1 mg total) by mouth daily. 10/24/21   Couture, Cortni S, PA-C  haloperidol (HALDOL) 10 MG tablet Take 1 tablet (10 mg total) by mouth 2 (two) times daily. 10/24/21   Couture, Cortni S, PA-C  thiamine 100 MG tablet Take 1 tablet (100 mg total) by mouth daily. 10/24/21   Couture, Cortni S, PA-C  traZODone (DESYREL) 100 MG tablet Take 1 tablet (100 mg total) by mouth at bedtime. 10/24/21   Couture, Cortni S, PA-C      Allergies    Citrus    Review of Systems   Review of Systems  Constitutional:  Negative for fever.  Respiratory:  Negative for shortness of breath.   Cardiovascular:  Negative for chest pain.  Gastrointestinal:  Negative for abdominal pain.  Psychiatric/Behavioral:  Negative for suicidal ideas.   All other systems reviewed and are negative.  Physical Exam Updated Vital Signs BP 133/87    Pulse 90    Temp 98 F (36.7 C)    Resp 17    Ht 5\' 11"  (1.803 m)    Wt 77.1 kg    SpO2 98%    BMI 23.71 kg/m  Physical Exam Vitals and nursing note reviewed.  HENT:     Head: Normocephalic and atraumatic.   Cardiovascular:     Rate and Rhythm: Normal rate and regular rhythm.  Pulmonary:     Effort: Pulmonary effort is normal.  Abdominal:     General: There is no distension.     Palpations: Abdomen is soft.     Tenderness: There is no abdominal tenderness.  Musculoskeletal:     Cervical back: Neck supple.  Skin:    General: Skin is warm and dry.  Neurological:     Mental Status: He is alert.  Psychiatric:        Behavior: Behavior is cooperative.        Thought Content: Thought content does not include suicidal ideation.     Comments: Does not appear to be responding to internal stimuli.     ED Results / Procedures / Treatments   Labs (all labs ordered are listed, but only abnormal results are displayed) Labs Reviewed - No data to display  EKG None  Radiology No results found.  Procedures Procedures    Medications Ordered in ED Medications - No data to display  ED Course/ Medical Decision Making/ A&P                           Medical Decision Making Risk Prescription drug  management.   Patient presents to the ED requesting medication refill Nontoxic, vitals without significant abnormality.  Short course medication refill provided, information for [facility] provided as well. He is also homeless, he was provided opportunity to rest/sleep in the ED for several hours, resources for shelter provided as well. Overall appears appropriate for discharge.         Final Clinical Impression(s) / ED Diagnoses Final diagnoses:  Medication refill    Rx / DC Orders ED Discharge Orders          Ordered    benztropine (COGENTIN) 1 MG tablet  2 times daily        12/31/21 0525              Cherly Anderson, PA-C 12/31/21 0601    Marily Memos, MD 12/31/21 [phone]

## 2022-03-14 ENCOUNTER — Emergency Department (HOSPITAL_COMMUNITY)
Admission: EM | Admit: 2022-03-14 | Discharge: 2022-03-14 | Discharge status: Against Medical Advice | Payer: Medicare Other | Attending: Emergency Medicine | Admitting: Emergency Medicine

## 2022-03-14 DIAGNOSIS — Z5321 Procedure and treatment not carried out due to patient leaving prior to being seen by health care provider: Secondary | ICD-10-CM | POA: Diagnosis not present

## 2022-03-14 DIAGNOSIS — R1084 Generalized abdominal pain: Secondary | ICD-10-CM | POA: Insufficient documentation

## 2022-03-14 LAB — CBC WITH DIFFERENTIAL/PLATELET
Abs Immature Granulocytes: 0.02 10*3/uL (ref 0.00–0.07)
Basophils Absolute: 0 10*3/uL (ref 0.0–0.1)
Basophils Relative: 1 %
Eosinophils Absolute: 0 10*3/uL (ref 0.0–0.5)
Eosinophils Relative: 0 %
HCT: 41.8 % (ref 39.0–52.0)
Hemoglobin: 13.2 g/dL (ref 13.0–17.0)
Immature Granulocytes: 0 %
Lymphocytes Relative: 30 %
Lymphs Abs: 1.9 10*3/uL (ref 0.7–4.0)
MCH: 23.4 pg — ABNORMAL LOW (ref 26.0–34.0)
MCHC: 31.6 g/dL (ref 30.0–36.0)
MCV: 74.2 fL — ABNORMAL LOW (ref 80.0–100.0)
Monocytes Absolute: 0.3 10*3/uL (ref 0.1–1.0)
Monocytes Relative: 5 %
Neutro Abs: 4.1 10*3/uL (ref 1.7–7.7)
Neutrophils Relative %: 64 %
Platelets: 408 10*3/uL — ABNORMAL HIGH (ref 150–400)
RBC: 5.63 MIL/uL (ref 4.22–5.81)
RDW: 15.5 % (ref 11.5–15.5)
WBC: 6.4 10*3/uL (ref 4.0–10.5)
nRBC: 0 % (ref 0.0–0.2)

## 2022-03-14 LAB — COMPREHENSIVE METABOLIC PANEL
ALT: 51 U/L — ABNORMAL HIGH (ref 0–44)
AST: 54 U/L — ABNORMAL HIGH (ref 15–41)
Albumin: 4.2 g/dL (ref 3.5–5.0)
Alkaline Phosphatase: 91 U/L (ref 38–126)
Anion gap: 6 (ref 5–15)
BUN: 20 mg/dL (ref 6–20)
CO2: 26 mmol/L (ref 22–32)
Calcium: 9.1 mg/dL (ref 8.9–10.3)
Chloride: 105 mmol/L (ref 98–111)
Creatinine, Ser: 0.83 mg/dL (ref 0.61–1.24)
GFR, Estimated: >60 mL/min (ref >60)
Glucose, Bld: 88 mg/dL (ref 70–99)
Potassium: 3.8 mmol/L (ref 3.5–5.1)
Sodium: 137 mmol/L (ref 135–145)
Total Bilirubin: 0.5 mg/dL (ref 0.3–1.2)
Total Protein: 7.6 g/dL (ref 6.5–8.1)

## 2022-03-14 LAB — URINALYSIS, ROUTINE W REFLEX MICROSCOPIC
Bilirubin Urine: NEGATIVE
Glucose, UA: NEGATIVE mg/dL
Hgb urine dipstick: NEGATIVE
Ketones, ur: NEGATIVE mg/dL
Leukocytes,Ua: NEGATIVE
Nitrite: NEGATIVE
Protein, ur: 30 mg/dL — AB
Specific Gravity, Urine: 1.031 — ABNORMAL HIGH (ref 1.005–1.030)
pH: 5 (ref 5.0–8.0)

## 2022-03-14 LAB — LIPASE, BLOOD: Lipase: 35 U/L (ref 11–51)

## 2022-03-14 NOTE — ED Notes (Signed)
Called pt name X4 no answer no were to be found. ?

## 2022-03-14 NOTE — ED Triage Notes (Signed)
Pt with generalized abdominal pain without n/v/d x 2 days. Normal bowel movements.  ?

## 2022-03-14 NOTE — ED Provider Triage Note (Signed)
?  Emergency Medicine Provider Triage Evaluation Note ? ?MRN:  [id number]  ?Arrival date & time: 03/14/22    ?Medically screening exam initiated at 5:01 AM.   ?CC:   ?Abdominal Pain ?  ?HPI:  ?Jeffrey Michael is a 36 y.o. year-old male presents to the ED with chief complaint of abdominal pain.  Onset today.  Denies other associated symptoms.  Denies prior abdominal surgeries. ? ?History provided by History provided by patient. ?ROS:  ?-As included in HPI ?PE:  ? ?Vitals:  ? 03/14/22 0452  ?BP: 123/79  ?Pulse: 90  ?Resp: 17  ?Temp: 97.8 ?F (36.6 ?C)  ?SpO2: 100%  ?  ?Non-toxic appearing ?No respiratory distress ?No focal tenderness ?MDM:  ? ? ?Patient was informed that the remainder of the evaluation will be completed by another provider, this initial triage assessment does not replace that evaluation, and the importance of remaining in the ED until their evaluation is complete. ? ?  ?Roxy Horseman, PA-C ?03/14/22 0502 ? ?

## 2022-03-15 ENCOUNTER — Emergency Department (HOSPITAL_COMMUNITY)
Admission: EM | Admit: 2022-03-15 | Discharge: 2022-03-15 | Discharge status: Against Medical Advice | Payer: Medicare Other | Attending: Emergency Medicine | Admitting: Emergency Medicine

## 2022-03-15 ENCOUNTER — Other Ambulatory Visit: Payer: Self-pay

## 2022-03-15 ENCOUNTER — Encounter (HOSPITAL_COMMUNITY): Payer: Self-pay | Admitting: Emergency Medicine

## 2022-03-15 DIAGNOSIS — Z5321 Procedure and treatment not carried out due to patient leaving prior to being seen by health care provider: Secondary | ICD-10-CM | POA: Diagnosis not present

## 2022-03-15 DIAGNOSIS — R109 Unspecified abdominal pain: Secondary | ICD-10-CM | POA: Diagnosis not present

## 2022-03-15 LAB — LIPASE, BLOOD: Lipase: 31 U/L (ref 11–51)

## 2022-03-15 LAB — COMPREHENSIVE METABOLIC PANEL
ALT: 48 U/L — ABNORMAL HIGH (ref 0–44)
AST: 47 U/L — ABNORMAL HIGH (ref 15–41)
Albumin: 4.1 g/dL (ref 3.5–5.0)
Alkaline Phosphatase: 91 U/L (ref 38–126)
Anion gap: 10 (ref 5–15)
BUN: 15 mg/dL (ref 6–20)
CO2: 20 mmol/L — ABNORMAL LOW (ref 22–32)
Calcium: 8.9 mg/dL (ref 8.9–10.3)
Chloride: 105 mmol/L (ref 98–111)
Creatinine, Ser: 1 mg/dL (ref 0.61–1.24)
GFR, Estimated: >60 mL/min (ref >60)
Glucose, Bld: 99 mg/dL (ref 70–99)
Potassium: 3.6 mmol/L (ref 3.5–5.1)
Sodium: 135 mmol/L (ref 135–145)
Total Bilirubin: 0.8 mg/dL (ref 0.3–1.2)
Total Protein: 7.4 g/dL (ref 6.5–8.1)

## 2022-03-15 LAB — CBC WITH DIFFERENTIAL/PLATELET
Abs Immature Granulocytes: 0.01 10*3/uL (ref 0.00–0.07)
Basophils Absolute: 0.1 10*3/uL (ref 0.0–0.1)
Basophils Relative: 1 %
Eosinophils Absolute: 0 10*3/uL (ref 0.0–0.5)
Eosinophils Relative: 0 %
HCT: 39.8 % (ref 39.0–52.0)
Hemoglobin: 12.9 g/dL — ABNORMAL LOW (ref 13.0–17.0)
Immature Granulocytes: 0 %
Lymphocytes Relative: 28 %
Lymphs Abs: 1.8 10*3/uL (ref 0.7–4.0)
MCH: 23.9 pg — ABNORMAL LOW (ref 26.0–34.0)
MCHC: 32.4 g/dL (ref 30.0–36.0)
MCV: 73.7 fL — ABNORMAL LOW (ref 80.0–100.0)
Monocytes Absolute: 0.4 10*3/uL (ref 0.1–1.0)
Monocytes Relative: 6 %
Neutro Abs: 4.2 10*3/uL (ref 1.7–7.7)
Neutrophils Relative %: 65 %
Platelets: 387 10*3/uL (ref 150–400)
RBC: 5.4 MIL/uL (ref 4.22–5.81)
RDW: 15.5 % (ref 11.5–15.5)
WBC: 6.5 10*3/uL (ref 4.0–10.5)
nRBC: 0 % (ref 0.0–0.2)

## 2022-03-15 LAB — URINALYSIS, ROUTINE W REFLEX MICROSCOPIC
Bilirubin Urine: NEGATIVE
Glucose, UA: NEGATIVE mg/dL
Hgb urine dipstick: NEGATIVE
Ketones, ur: NEGATIVE mg/dL
Leukocytes,Ua: NEGATIVE
Nitrite: NEGATIVE
Protein, ur: NEGATIVE mg/dL
Specific Gravity, Urine: 1.03 (ref 1.005–1.030)
pH: 5 (ref 5.0–8.0)

## 2022-03-15 NOTE — ED Provider Triage Note (Signed)
Emergency Medicine Provider Triage Evaluation Note ? ?Jeffrey Michael , a 36 y.o. male  was evaluated in triage.  Pt complains of abdominal pain.  States he has been eating poorly lately.  Denies nausea, vomiting, diarrhea.  No fever.  No prior abdominal surgeries. ? ?Review of Systems  ?Positive: Abdominal pain ?Negative: fever ? ?Physical Exam  ?BP (!) 142/95 (BP Location: Right Arm)   Pulse 87   Temp 98 ?F (36.7 ?C) (Oral)   Resp 16   SpO2 100%  ?Gen:   Awake, no distress   ?Resp:  Normal effort  ?MSK:   Moves extremities without difficulty  ?Other:   ? ?Medical Decision Making  ?Medically screening exam initiated at 5:42 AM.  Appropriate orders placed.  Jeffrey Michael was informed that the remainder of the evaluation will be completed by another provider, this initial triage assessment does not replace that evaluation, and the importance of remaining in the ED until their evaluation is complete. ? ?Abdominal pain.  Non-specific symptoms.  Labs and UA ordered. ?  ?Garlon Hatchet, PA-C ?03/15/22 0544 ? ?

## 2022-03-15 NOTE — ED Notes (Signed)
Pt called for vs check, no response. ?

## 2022-03-15 NOTE — ED Notes (Signed)
Pt called for vs x3, no response.  ?

## 2022-03-15 NOTE — ED Notes (Signed)
Called for vitals x2, no response.

## 2022-03-15 NOTE — ED Triage Notes (Signed)
Patient here with abdominal cramping when he lays down.  Patient denies any nausea or vomiting.   ?

## 2022-03-15 NOTE — ED Notes (Signed)
Patient refused to sign MSE waiver.  

## 2022-03-18 ENCOUNTER — Other Ambulatory Visit: Payer: Self-pay

## 2022-03-18 ENCOUNTER — Encounter (HOSPITAL_COMMUNITY): Payer: Self-pay | Admitting: Emergency Medicine

## 2022-03-18 ENCOUNTER — Emergency Department (HOSPITAL_COMMUNITY)
Admission: EM | Admit: 2022-03-18 | Discharge: 2022-03-18 | Disposition: A | Discharge status: Home / Self Care | Payer: Medicare Other | Attending: Emergency Medicine | Admitting: Emergency Medicine

## 2022-03-18 DIAGNOSIS — M79671 Pain in right foot: Secondary | ICD-10-CM | POA: Insufficient documentation

## 2022-03-18 DIAGNOSIS — M79672 Pain in left foot: Secondary | ICD-10-CM | POA: Insufficient documentation

## 2022-03-18 DIAGNOSIS — Z5321 Procedure and treatment not carried out due to patient leaving prior to being seen by health care provider: Secondary | ICD-10-CM | POA: Insufficient documentation

## 2022-03-18 NOTE — ED Triage Notes (Signed)
Pt reported to ED with c/o pain and blisters to feet. Pt presents barefoot with shoes in hand, refuses to wear them.  ?

## 2022-03-18 NOTE — ED Provider Triage Note (Signed)
Emergency Medicine Provider Triage Evaluation Note ? ?Jeffrey Michael , a 36 y.o. male  was evaluated in triage.  Pt complains of complaining of bilateral footpain after walking barefoot on the sidewalk off [address]. The pavement was getting hot and causing blisters on his feet. Refuses to wear shoes in hand, states the shoes are causing blisters. Denies h/o DM  ? ?Review of Systems  ?Positive: As above  ?Negative: As above  ? ?Physical Exam  ?There were no vitals taken for this visit. ?Gen:   Awake, no distress   ?Resp:  Normal effort  ?MSK:   Moves extremities without difficulty,  feet with no evidence of deformities, patient ambulated without difficulty, feet are very dirty with a few scant blisters but no evidence of abscess ?Other:   ? ?Medical Decision Making  ?Medically screening exam initiated at 3:39 AM.  Appropriate orders placed.  Jeffrey Michael was informed that the remainder of the evaluation will be completed by another provider, this initial triage assessment does not replace that evaluation, and the importance of remaining in the ED until their evaluation is complete. ? ? ?  ?Mare Ferrari, PA-C ?03/18/22 0341 ? ?

## 2022-03-24 ENCOUNTER — Other Ambulatory Visit: Payer: Self-pay

## 2022-03-24 ENCOUNTER — Emergency Department (HOSPITAL_COMMUNITY)
Admission: EM | Admit: 2022-03-24 | Discharge: 2022-03-24 | Disposition: A | Discharge status: Home / Self Care | Payer: Medicare Other | Attending: Emergency Medicine | Admitting: Emergency Medicine

## 2022-03-24 ENCOUNTER — Encounter (HOSPITAL_COMMUNITY): Payer: Self-pay

## 2022-03-24 ENCOUNTER — Emergency Department (HOSPITAL_COMMUNITY): Payer: Medicare Other

## 2022-03-24 DIAGNOSIS — R7401 Elevation of levels of liver transaminase levels: Secondary | ICD-10-CM | POA: Insufficient documentation

## 2022-03-24 DIAGNOSIS — K529 Noninfective gastroenteritis and colitis, unspecified: Secondary | ICD-10-CM | POA: Diagnosis not present

## 2022-03-24 DIAGNOSIS — Z5900 Homelessness unspecified: Secondary | ICD-10-CM | POA: Insufficient documentation

## 2022-03-24 DIAGNOSIS — K5900 Constipation, unspecified: Secondary | ICD-10-CM | POA: Insufficient documentation

## 2022-03-24 DIAGNOSIS — R1031 Right lower quadrant pain: Secondary | ICD-10-CM | POA: Diagnosis present

## 2022-03-24 DIAGNOSIS — D649 Anemia, unspecified: Secondary | ICD-10-CM | POA: Diagnosis not present

## 2022-03-24 LAB — COMPREHENSIVE METABOLIC PANEL
ALT: 42 U/L (ref 0–44)
AST: 69 U/L — ABNORMAL HIGH (ref 15–41)
Albumin: 4 g/dL (ref 3.5–5.0)
Alkaline Phosphatase: 88 U/L (ref 38–126)
Anion gap: 8 (ref 5–15)
BUN: 33 mg/dL — ABNORMAL HIGH (ref 6–20)
CO2: 26 mmol/L (ref 22–32)
Calcium: 9.5 mg/dL (ref 8.9–10.3)
Chloride: 106 mmol/L (ref 98–111)
Creatinine, Ser: 1.16 mg/dL (ref 0.61–1.24)
GFR, Estimated: >60 mL/min (ref >60)
Glucose, Bld: 88 mg/dL (ref 70–99)
Potassium: 3.8 mmol/L (ref 3.5–5.1)
Sodium: 140 mmol/L (ref 135–145)
Total Bilirubin: 0.8 mg/dL (ref 0.3–1.2)
Total Protein: 7.1 g/dL (ref 6.5–8.1)

## 2022-03-24 LAB — URINALYSIS, ROUTINE W REFLEX MICROSCOPIC
Bilirubin Urine: NEGATIVE
Glucose, UA: NEGATIVE mg/dL
Hgb urine dipstick: NEGATIVE
Ketones, ur: NEGATIVE mg/dL
Leukocytes,Ua: NEGATIVE
Nitrite: NEGATIVE
Protein, ur: NEGATIVE mg/dL
Specific Gravity, Urine: 1.027 (ref 1.005–1.030)
pH: 5 (ref 5.0–8.0)

## 2022-03-24 LAB — CBC
HCT: 40.2 % (ref 39.0–52.0)
Hemoglobin: 12.5 g/dL — ABNORMAL LOW (ref 13.0–17.0)
MCH: 23.1 pg — ABNORMAL LOW (ref 26.0–34.0)
MCHC: 31.1 g/dL (ref 30.0–36.0)
MCV: 74.4 fL — ABNORMAL LOW (ref 80.0–100.0)
Platelets: 364 10*3/uL (ref 150–400)
RBC: 5.4 MIL/uL (ref 4.22–5.81)
RDW: 15.5 % (ref 11.5–15.5)
WBC: 6.3 10*3/uL (ref 4.0–10.5)
nRBC: 0 % (ref 0.0–0.2)

## 2022-03-24 LAB — LIPASE, BLOOD: Lipase: 39 U/L (ref 11–51)

## 2022-03-24 MED ORDER — IOHEXOL 300 MG/ML  SOLN
100.0000 mL | Freq: Once | INTRAMUSCULAR | Status: AC | PRN
  Administered 2022-03-24: 100 mL via INTRAVENOUS

## 2022-03-24 MED ORDER — DICYCLOMINE HCL 10 MG PO CAPS
20.0000 mg | ORAL_CAPSULE | Freq: Once | ORAL | Status: AC
  Administered 2022-03-24: 20 mg via ORAL
  Filled 2022-03-24: qty 2

## 2022-03-24 MED ORDER — DICYCLOMINE HCL 20 MG PO TABS
20.0000 mg | ORAL_TABLET | Freq: Three times a day (TID) | ORAL | 0 refills | Status: AC | PRN
Start: 2022-03-24 — End: ?

## 2022-03-24 NOTE — ED Notes (Signed)
Patient transported to CT 

## 2022-03-24 NOTE — ED Triage Notes (Signed)
Pt report RLQ pain x2 days. Pt denies any N&V&D. Pt reports his a cramping sensation located in that area.  ?

## 2022-03-24 NOTE — ED Provider Notes (Signed)
?[facility] ?Provider Note ? ? ?CSN: [id number] ?Arrival date & time: 03/24/22  0451 ? ?  ? ?History ? ?Chief Complaint  ?Patient presents with  ? Abdominal Pain  ? ? ?Jeffrey Michael is a 36 y.o. male with a hx of bipolar 1 disorder, schizophrenia, and homelessness who presents to the ED with complaints of abdominal pain x 2-3 days. Patient reports waxing/waning pain to the RLQ, crampy in nature, no alleviating/aggravating factors. Denies fever, chills, nausea, vomiting, diarrhea, dysuria, or testicular pain/swelling. Denies prior abdominal surgeries.  ? ?HPI ? ?  ? ?Home Medications ?Prior to Admission medications   ?Medication Sig Start Date End Date Taking? Authorizing Provider  ?benztropine (COGENTIN) 1 MG tablet Take 1 tablet (1 mg total) by mouth 2 (two) times daily. 12/31/21   Barnett Elzey, Lelon MastSamantha R, PA-C  ?folic acid (FOLVITE) 1 MG tablet Take 1 tablet (1 mg total) by mouth daily. 10/24/21   Couture, Cortni S, PA-C  ?haloperidol (HALDOL) 10 MG tablet Take 1 tablet (10 mg total) by mouth 2 (two) times daily. 10/24/21   Couture, Cortni S, PA-C  ?thiamine 100 MG tablet Take 1 tablet (100 mg total) by mouth daily. 10/24/21   Couture, Cortni S, PA-C  ?traZODone (DESYREL) 100 MG tablet Take 1 tablet (100 mg total) by mouth at bedtime. 10/24/21   Couture, Cortni S, PA-C  ?   ? ?Allergies    ?Citrus   ? ?Review of Systems   ?Review of Systems  ?Constitutional:  Negative for chills and fever.  ?Respiratory:  Negative for shortness of breath.   ?Cardiovascular:  Negative for chest pain.  ?Gastrointestinal:  Positive for abdominal pain. Negative for blood in stool, diarrhea, nausea and vomiting.  ?Genitourinary:  Negative for dysuria, scrotal swelling and testicular pain.  ?Neurological:  Negative for syncope.  ?All other systems reviewed and are negative. ? ?Physical Exam ?Updated Vital Signs ?BP 127/86   Pulse 97   Temp 98.2 ?F (36.8 ?C) (Oral)   Resp 18   SpO2 100%  ?Physical  Exam ?Vitals and nursing note reviewed.  ?Constitutional:   ?   General: He is not in acute distress. ?   Appearance: He is well-developed. He is not toxic-appearing.  ?HENT:  ?   Head: Normocephalic and atraumatic.  ?Eyes:  ?   General:     ?   Right eye: No discharge.     ?   Left eye: No discharge.  ?   Conjunctiva/sclera: Conjunctivae normal.  ?Cardiovascular:  ?   Rate and Rhythm: Normal rate and regular rhythm.  ?Pulmonary:  ?   Effort: No respiratory distress.  ?   Breath sounds: Normal breath sounds. No wheezing or rales.  ?Abdominal:  ?   General: There is no distension.  ?   Palpations: Abdomen is soft.  ?   Tenderness: There is abdominal tenderness (minimal) in the right lower quadrant. There is no right CVA tenderness, left CVA tenderness, guarding or rebound.  ?Musculoskeletal:  ?   Cervical back: Neck supple.  ?Skin: ?   General: Skin is warm and dry.  ?Neurological:  ?   Mental Status: He is alert.  ?   Comments: Clear speech.   ?Psychiatric:     ?   Behavior: Behavior normal.  ? ? ?ED Results / Procedures / Treatments   ?Labs ?(all labs ordered are listed, but only abnormal results are displayed) ?Labs Reviewed  ?URINALYSIS, ROUTINE W REFLEX MICROSCOPIC - Abnormal; Notable  for the following components:  ?    Result Value  ? APPearance HAZY (*)   ? All other components within normal limits  ?LIPASE, BLOOD  ?COMPREHENSIVE METABOLIC PANEL  ?CBC  ? ? ?EKG ?None ? ?Radiology ?CT Abdomen Pelvis W Contrast ? ?Result Date: 03/24/2022 ?CLINICAL DATA:  Right lower quadrant pain and cramping. Onset 2 days ago. EXAM: CT ABDOMEN AND PELVIS WITH CONTRAST TECHNIQUE: Multidetector CT imaging of the abdomen and pelvis was performed using the standard protocol following bolus administration of intravenous contrast. RADIATION DOSE REDUCTION: This exam was performed according to the departmental dose-optimization program which includes automated exposure control, adjustment of the mA and/or kV according to patient size  and/or use of iterative reconstruction technique. CONTRAST:  OMNIPAQUE IOHEXOL 300 MG/ML  SOLN COMPARISON:  None. FINDINGS: Factors affecting image quality: The patient has very little body fat. This impairs subject contrast particularly with regard to separation between structures in the abdominal cavity. Lower chest: No acute abnormality. Hepatobiliary: No liver mass enhancement is seen. The gallbladder and bile ducts unremarkable. Pancreas: Not well seen due to overlapping structures. No obvious mass is seen and no ductal dilatation. Spleen: Unremarkable. Adrenals/Urinary Tract: There is no adrenal mass no focal renal cortical mass. No urinary stone or obstruction is seen. The bladder is not fully distended but could be circumferentially thickened. If this is true wall thickening this would be unusual for a 36 year old. Stomach/Bowel: Not well evaluated due to unopacified overlapping bowel. There is fold thickening in the stomach. There is probably fold thickening in the jejunum with slight enhancement of fluid-filled lower abdominal small bowel. There is no dilated small bowel. No obvious mesenteric edema but the mesentery is not well seen. Moderate fecal stasis ascending and transverse colon noted without focal wall thickening or diverticulitis. Only the proximal 1/3 of the appendix is visible. The remainder is obscured by overlapping structures. Proximal appendix is unremarkable but distal appendicitis could be missed. Vascular/Lymphatic: There is mild prominence of the hepatic portal vein which measures 15.2 mm. There is a circumaortic left renal vein. The aorta is normal. No adenopathy is seen allowing for unopacified small bowel. There is pelvic venous congestion in the side walls. This is more so on the left. Reproductive: The prostate is not enlarged. Both testicles are in the scrotal sac. There is no hydrocele. Other: There is no free air, hemorrhage, free fluid or abscess. There is no incarcerated  hernia. The anterior wall is intact. Musculoskeletal: No acute or significant osseous findings. IMPRESSION: 1. Limited exam. The appendix is only visible proximally, the distal appendix obscured by overlapping structures. The visualized appendix is normal caliber. 2. Gastroenteritis without evidence of small-bowel obstruction or pneumatosis. 3. Constipation. 4. Cystitis versus bladder nondistention.  No prostatomegaly. 5. Mildly prominent hepatic portal vein. 6. Left-greater-than-right pelvic venous congestion. Electronically Signed   By: Almira Bar M.D.   On: 03/24/2022 06:47   ? ?Procedures ?Procedures  ? ? ?Medications Ordered in ED ?Medications - No data to display ? ?ED Course/ Medical Decision Making/ A&P ?  ?                        ?Medical Decision Making ?Amount and/or Complexity of Data Reviewed ?Labs: ordered. ?Radiology: ordered. ? ?Risk ?Prescription drug management. ? ? ?Patient presents to the ED with complaints of abdominal pain, this involves an extensive number of treatment options, and is a complaint that carries with it a high risk of complications  and morbidity. Nontoxic, vitals unremarkable..  ? ?Ddx including but not limited to: appendicitis, nephrolithiasis, pyelonephritis, MSK pain, PUD, GERD, hernia, viral GI illness, constipation, perf obstruction, IBS ? ?Additional history obtained:  ?Chart & nursing note reviewed.  ? ?Lab Tests:  ?I viewed & interpreted labs including:  ?UA: Unremarkable ?CBC: Anemia similar to prior ranges ?CMP: Elevated BUN, normal creatinine mild AST elevation. ?Lipase: Within normal limits ? ?Imaging Studies ordered:  ?I ordered and viewed the following imaging, agree with radiologist impression:  ?CT A/P: 1. Limited exam. The appendix is only visible proximally, the distal appendix obscured by overlapping structures. The visualized appendix is normal caliber. 2. Gastroenteritis without evidence of small-bowel obstruction or pneumatosis. 3. Constipation. 4.  Cystitis versus bladder nondistention.  No prostatomegaly. 5. Mildly prominent hepatic portal vein. 6. Left-greater-than-right pelvic venous congestion. ? ?ED Course:  ?While CT is limited for assessment of t

## 2022-03-24 NOTE — Discharge Instructions (Addendum)
You were seen in the emergency department today for abdominal cramping/pain.  Your labs showed anemia as well as an elevated AST and BUN.  Please have these re checked by your primary care provider.  Your CT scan did not show any obvious findings of appendicitis.  You did have some findings of constipation, please take MiraLAX per over-the-counter dosing as needed for bowel movements.  You do have findings of pelvic venous congestion, please discuss this with your primary care provider.  Take Bentyl every 8 hours as needed for abdominal cramping. ? ?We have prescribed you new medication(s) today. Discuss the medications prescribed today with your pharmacist as they can have adverse effects and interactions with your other medicines including over the counter and prescribed medications. Seek medical evaluation if you start to experience new or abnormal symptoms after taking one of these medicines, seek care immediately if you start to experience difficulty breathing, feeling of your throat closing, facial swelling, or rash as these could be indications of a more serious allergic reaction ? ?Follow-up with primary care soon as possible.  Return to the emergency department for new or worsening symptoms or any other concerns. ? ? ? ? ?

## 2022-03-25 ENCOUNTER — Other Ambulatory Visit: Payer: Self-pay

## 2022-03-25 ENCOUNTER — Encounter (HOSPITAL_COMMUNITY): Payer: Self-pay

## 2022-03-25 ENCOUNTER — Emergency Department (HOSPITAL_COMMUNITY)
Admission: EM | Admit: 2022-03-25 | Discharge: 2022-03-25 | Disposition: A | Discharge status: Home / Self Care | Payer: Medicare Other | Attending: Emergency Medicine | Admitting: Emergency Medicine

## 2022-03-25 DIAGNOSIS — Z760 Encounter for issue of repeat prescription: Secondary | ICD-10-CM | POA: Diagnosis not present

## 2022-03-25 DIAGNOSIS — Z59819 Housing instability, housed unspecified: Secondary | ICD-10-CM | POA: Diagnosis present

## 2022-03-25 NOTE — ED Provider Notes (Signed)
?  [facility]-EMERGENCY DEPT ?[facility] Emergency Department ?Provider Note ?MRN:  [id number]  ?Arrival date & time: 03/25/22    ? ?Chief Complaint   ?Medication Refill ?  ?History of Present Illness   ?MALEEK CRAVER is a 36 y.o. year-old male presents to the ED with chief complaint of needing to take his white pill.  He doesn't know what it's called. He denies any complaints currently.  He is homeless and doesn't have anywhere to stay at the moment. ? ?History provided by patient. ? ? ?Review of Systems  ?Pertinent review of systems noted in HPI.  ? ? ?Physical Exam  ? ?Vitals:  ? 03/25/22 0416  ?BP: 134/72  ?Pulse: 70  ?Resp: 18  ?Temp: (!) 97.4 ?F (36.3 ?C)  ?SpO2: 100%  ?  ?CONSTITUTIONAL:  well-appearing, NAD ?NEURO:  Alert and oriented x 3, CN 3-12 grossly intact ?EYES:  eyes equal and reactive ?ENT/NECK:  Supple, no stridor  ?CARDIO:  , appears well-perfused  ?PULM:  No respiratory distress,  ?GI/GU:  non-distended,  ?MSK/SPINE:  No gross deformities, no edema, moves all extremities  ?SKIN:  no rash, atraumatic ? ? ?*Additional and/or pertinent findings included in MDM below ? ?Diagnostic and Interventional Summary  ? ? EKG Interpretation ? ?Date/Time:    ?Ventricular Rate:    ?PR Interval:    ?QRS Duration:   ?QT Interval:    ?QTC Calculation:   ?R Axis:     ?Text Interpretation:   ?  ? ?  ? ?Labs Reviewed - No data to display  ?No orders to display  ?  ?Medications - No data to display  ? ?Procedures  /  Critical Care ?Procedures ? ?ED Course and Medical Decision Making  ?I have reviewed the triage vital signs, the nursing notes, and pertinent available records from the EMR. ? ?Complexity of Problems Addressed: ?Low Complexity: Stable, chronic illness  ?Comorbidities affecting this illness/injury include: ?Housing instability ?Social Determinants Affecting Care: ?Complexity of care is increased due to homelessness. ?Discussed patient's lack of housing.  Resources provided for homeless shelters. ? ?ED  Course: ?Discussed that he will need to discuss his medications with a PCP.  Resources given.  Suspect that he came in for a place to stay rather than acute emergent problem. ?  ? ?Consultants: ?No consultations were needed in caring for this patient. ? ?Treatment and Plan: ? ? ?Emergency department workup does not suggest an emergent condition requiring admission or immediate intervention beyond  what has been performed at this time. The patient is safe for discharge and has  been instructed to return immediately for worsening symptoms, change in  symptoms or any other concerns ? ? ? ?Final Clinical Impressions(s) / ED Diagnoses  ? ?  ICD-10-CM   ?1. Housing instability  Z59.819   ?  ?  ?ED Discharge Orders   ? ? None  ? ?  ?  ? ? ?Discharge Instructions Discussed with and Provided to Patient:  ? ?Discharge Instructions   ?None ?  ? ?  ?Roxy Horseman, PA-C ?03/25/22 5929 ? ?  ?Nira Conn, MD ?03/25/22 [phone] ? ?

## 2022-03-25 NOTE — ED Triage Notes (Signed)
Pt arrived to the ED stating he needs his medication and to find a place to stay before it gets too late.  ?

## 2022-03-26 ENCOUNTER — Emergency Department (HOSPITAL_COMMUNITY)
Admission: EM | Admit: 2022-03-26 | Discharge: 2022-03-26 | Discharge status: Against Medical Advice | Payer: Medicare Other

## 2022-03-26 NOTE — ED Notes (Signed)
PT WAS CALLED MULTIPLE TIMES FOR TRIAGE AND DID NOT RESPOND ?

## 2022-04-01 ENCOUNTER — Inpatient Hospital Stay (HOSPITAL_COMMUNITY)
Admission: EM | Admit: 2022-04-01 | Discharge: 2022-05-02 | DRG: 963 | Disposition: E | Payer: Medicare Other | Attending: Surgery | Admitting: Surgery

## 2022-04-01 ENCOUNTER — Emergency Department (HOSPITAL_COMMUNITY): Payer: Medicare Other

## 2022-04-01 DIAGNOSIS — S06A1XA Traumatic brain compression with herniation, initial encounter: Secondary | ICD-10-CM | POA: Diagnosis present

## 2022-04-01 DIAGNOSIS — S90811A Abrasion, right foot, initial encounter: Secondary | ICD-10-CM | POA: Diagnosis present

## 2022-04-01 DIAGNOSIS — Z66 Do not resuscitate: Secondary | ICD-10-CM | POA: Diagnosis not present

## 2022-04-01 DIAGNOSIS — S36112A Contusion of liver, initial encounter: Secondary | ICD-10-CM | POA: Diagnosis present

## 2022-04-01 DIAGNOSIS — S2241XA Multiple fractures of ribs, right side, initial encounter for closed fracture: Secondary | ICD-10-CM | POA: Diagnosis present

## 2022-04-01 DIAGNOSIS — S80212A Abrasion, left knee, initial encounter: Secondary | ICD-10-CM | POA: Diagnosis present

## 2022-04-01 DIAGNOSIS — T1490XA Injury, unspecified, initial encounter: Secondary | ICD-10-CM | POA: Diagnosis present

## 2022-04-01 DIAGNOSIS — S022XXA Fracture of nasal bones, initial encounter for closed fracture: Secondary | ICD-10-CM | POA: Diagnosis present

## 2022-04-01 DIAGNOSIS — Z23 Encounter for immunization: Secondary | ICD-10-CM | POA: Diagnosis present

## 2022-04-01 DIAGNOSIS — S40811A Abrasion of right upper arm, initial encounter: Secondary | ICD-10-CM | POA: Diagnosis present

## 2022-04-01 DIAGNOSIS — S0181XA Laceration without foreign body of other part of head, initial encounter: Secondary | ICD-10-CM | POA: Diagnosis present

## 2022-04-01 DIAGNOSIS — S065X9A Traumatic subdural hemorrhage with loss of consciousness of unspecified duration, initial encounter: Secondary | ICD-10-CM | POA: Diagnosis present

## 2022-04-01 DIAGNOSIS — Z20822 Contact with and (suspected) exposure to covid-19: Secondary | ICD-10-CM | POA: Diagnosis present

## 2022-04-01 DIAGNOSIS — R578 Other shock: Secondary | ICD-10-CM | POA: Diagnosis present

## 2022-04-01 DIAGNOSIS — S92312B Displaced fracture of first metatarsal bone, left foot, initial encounter for open fracture: Secondary | ICD-10-CM | POA: Diagnosis present

## 2022-04-01 DIAGNOSIS — J9600 Acute respiratory failure, unspecified whether with hypoxia or hypercapnia: Secondary | ICD-10-CM | POA: Diagnosis present

## 2022-04-01 DIAGNOSIS — S90812A Abrasion, left foot, initial encounter: Secondary | ICD-10-CM | POA: Diagnosis present

## 2022-04-01 DIAGNOSIS — G9382 Brain death: Secondary | ICD-10-CM | POA: Diagnosis present

## 2022-04-01 DIAGNOSIS — S061X9A Traumatic cerebral edema with loss of consciousness of unspecified duration, initial encounter: Secondary | ICD-10-CM | POA: Diagnosis present

## 2022-04-01 DIAGNOSIS — Y92411 Interstate highway as the place of occurrence of the external cause: Secondary | ICD-10-CM

## 2022-04-01 DIAGNOSIS — S72141A Displaced intertrochanteric fracture of right femur, initial encounter for closed fracture: Secondary | ICD-10-CM | POA: Diagnosis present

## 2022-04-01 DIAGNOSIS — I609 Nontraumatic subarachnoid hemorrhage, unspecified: Principal | ICD-10-CM

## 2022-04-01 DIAGNOSIS — E232 Diabetes insipidus: Secondary | ICD-10-CM | POA: Diagnosis present

## 2022-04-01 DIAGNOSIS — S0531XA Ocular laceration without prolapse or loss of intraocular tissue, right eye, initial encounter: Secondary | ICD-10-CM | POA: Diagnosis present

## 2022-04-01 DIAGNOSIS — S066X9A Traumatic subarachnoid hemorrhage with loss of consciousness of unspecified duration, initial encounter: Principal | ICD-10-CM | POA: Diagnosis present

## 2022-04-01 DIAGNOSIS — S065XAA Traumatic subdural hemorrhage with loss of consciousness status unknown, initial encounter: Secondary | ICD-10-CM

## 2022-04-01 DIAGNOSIS — I959 Hypotension, unspecified: Secondary | ICD-10-CM | POA: Diagnosis present

## 2022-04-01 DIAGNOSIS — S27322A Contusion of lung, bilateral, initial encounter: Secondary | ICD-10-CM | POA: Diagnosis present

## 2022-04-01 DIAGNOSIS — S72001G Fracture of unspecified part of neck of right femur, subsequent encounter for closed fracture with delayed healing: Secondary | ICD-10-CM

## 2022-04-01 LAB — CBC
HCT: 35.2 % — ABNORMAL LOW (ref 39.0–52.0)
Hemoglobin: 11.2 g/dL — ABNORMAL LOW (ref 13.0–17.0)
MCH: 24 pg — ABNORMAL LOW (ref 26.0–34.0)
MCHC: 31.8 g/dL (ref 30.0–36.0)
MCV: 75.4 fL — ABNORMAL LOW (ref 80.0–100.0)
Platelets: 234 10*3/uL (ref 150–400)
RBC: 4.67 MIL/uL (ref 4.22–5.81)
RDW: 15.7 % — ABNORMAL HIGH (ref 11.5–15.5)
WBC: 9.6 10*3/uL (ref 4.0–10.5)
nRBC: 0 % (ref 0.0–0.2)

## 2022-04-01 LAB — I-STAT CHEM 8, ED
BUN: 22 mg/dL (ref 8–23)
Calcium, Ion: 1.1 mmol/L — ABNORMAL LOW (ref 1.15–1.40)
Chloride: 108 mmol/L (ref 98–111)
Creatinine, Ser: 1.3 mg/dL — ABNORMAL HIGH (ref 0.61–1.24)
Glucose, Bld: 144 mg/dL — ABNORMAL HIGH (ref 70–99)
HCT: 36 % — ABNORMAL LOW (ref 39.0–52.0)
Hemoglobin: 12.2 g/dL — ABNORMAL LOW (ref 13.0–17.0)
Potassium: 3.3 mmol/L — ABNORMAL LOW (ref 3.5–5.1)
Sodium: 144 mmol/L (ref 135–145)
TCO2: 23 mmol/L (ref 22–32)

## 2022-04-01 LAB — RESP PANEL BY RT-PCR (FLU A&B, COVID) ARPGX2
Influenza A by PCR: NEGATIVE
Influenza B by PCR: NEGATIVE
SARS Coronavirus 2 by RT PCR: NEGATIVE

## 2022-04-01 LAB — I-STAT ARTERIAL BLOOD GAS, ED
Acid-Base Excess: 0 mmol/L (ref 0.0–2.0)
Bicarbonate: 24.6 mmol/L (ref 20.0–28.0)
Calcium, Ion: 1.14 mmol/L — ABNORMAL LOW (ref 1.15–1.40)
HCT: 24 % — ABNORMAL LOW (ref 39.0–52.0)
Hemoglobin: 8.2 g/dL — ABNORMAL LOW (ref 13.0–17.0)
O2 Saturation: 100 %
Patient temperature: 97.2
Potassium: 3.1 mmol/L — ABNORMAL LOW (ref 3.5–5.1)
Sodium: 145 mmol/L (ref 135–145)
TCO2: 26 mmol/L (ref 22–32)
pCO2 arterial: 36.8 mmHg (ref 32–48)
pH, Arterial: 7.43 (ref 7.35–7.45)
pO2, Arterial: 489 mmHg — ABNORMAL HIGH (ref 83–108)

## 2022-04-01 LAB — ETHANOL: Alcohol, Ethyl (B): <10 mg/dL (ref <10)

## 2022-04-01 LAB — PROTIME-INR
INR: 1.2 (ref 0.8–1.2)
Prothrombin Time: 15.5 seconds — ABNORMAL HIGH (ref 11.4–15.2)

## 2022-04-01 LAB — LACTIC ACID, PLASMA: Lactic Acid, Venous: 2 mmol/L (ref 0.5–1.9)

## 2022-04-01 MED ORDER — CEFAZOLIN SODIUM-DEXTROSE 2-4 GM/100ML-% IV SOLN
2.0000 g | Freq: Once | INTRAVENOUS | Status: AC
  Administered 2022-04-01: 2 g via INTRAVENOUS
  Filled 2022-04-01: qty 100

## 2022-04-01 MED ORDER — DOCUSATE SODIUM 50 MG/5ML PO LIQD: 100.0000 mg | Freq: Two times a day (BID) | ORAL | Status: DC

## 2022-04-01 MED ORDER — SODIUM CHLORIDE 0.9 % IV SOLN
INTRAVENOUS | Status: AC | PRN
  Administered 2022-04-01 (×3): 1000 mL via INTRAVENOUS

## 2022-04-01 MED ORDER — SUCCINYLCHOLINE CHLORIDE 20 MG/ML IJ SOLN
INTRAMUSCULAR | Status: AC | PRN
  Administered 2022-04-01: 140 mg via INTRAVENOUS

## 2022-04-01 MED ORDER — FENTANYL BOLUS VIA INFUSION
50.0000 ug | INTRAVENOUS | Status: DC | PRN
  Filled 2022-04-01: qty 100

## 2022-04-01 MED ORDER — PHENYLEPHRINE HCL-NACL 20-0.9 MG/250ML-% IV SOLN
INTRAVENOUS | Status: AC
Start: 2022-04-01 — End: 2022-04-01
  Administered 2022-04-01: 400 ug/min
  Filled 2022-04-01: qty 250

## 2022-04-01 MED ORDER — PROPOFOL 1000 MG/100ML IV EMUL
INTRAVENOUS | Status: AC
  Administered 2022-04-01: 20 ug/kg/min via INTRAVENOUS
  Filled 2022-04-01: qty 100

## 2022-04-01 MED ORDER — ETOMIDATE 2 MG/ML IV SOLN
INTRAVENOUS | Status: AC | PRN
  Administered 2022-04-01: 20 mg via INTRAVENOUS

## 2022-04-01 MED ORDER — FENTANYL 2500MCG IN NS 250ML (10MCG/ML) PREMIX INFUSION
50.0000 ug/h | INTRAVENOUS | Status: DC
  Filled 2022-04-01: qty 250

## 2022-04-01 MED ORDER — SODIUM CHLORIDE 0.9 % IV SOLN: INTRAVENOUS | Status: DC

## 2022-04-01 MED ORDER — PROPOFOL 1000 MG/100ML IV EMUL: 0.0000 ug/kg/min | INTRAVENOUS | Status: DC

## 2022-04-01 MED ORDER — TETANUS-DIPHTH-ACELL PERTUSSIS 5-2.5-18.5 LF-MCG/0.5 IM SUSY
0.5000 mL | PREFILLED_SYRINGE | Freq: Once | INTRAMUSCULAR | Status: AC
  Administered 2022-04-01: 0.5 mL via INTRAMUSCULAR
  Filled 2022-04-01: qty 0.5

## 2022-04-01 MED ORDER — LEVETIRACETAM IN NACL 500 MG/100ML IV SOLN
500.0000 mg | Freq: Two times a day (BID) | INTRAVENOUS | Status: DC
  Administered 2022-04-01: 500 mg via INTRAVENOUS
  Filled 2022-04-01: qty 100

## 2022-04-01 MED ORDER — SODIUM CHLORIDE 3 % IV SOLN
INTRAVENOUS | Status: DC
  Filled 2022-04-01 (×2): qty 500

## 2022-04-01 MED ORDER — FENTANYL CITRATE (PF) 100 MCG/2ML IJ SOLN
INTRAMUSCULAR | Status: AC
  Filled 2022-04-01: qty 2

## 2022-04-01 MED ORDER — ONDANSETRON 4 MG PO TBDP: 4.0000 mg | ORAL_TABLET | Freq: Four times a day (QID) | ORAL | Status: DC | PRN

## 2022-04-01 MED ORDER — FENTANYL CITRATE PF 50 MCG/ML IJ SOSY: 50.0000 ug | PREFILLED_SYRINGE | Freq: Once | INTRAMUSCULAR | Status: DC

## 2022-04-01 MED ORDER — FENTANYL 2500MCG IN NS 250ML (10MCG/ML) PREMIX INFUSION: 50.0000 ug/h | INTRAVENOUS | Status: DC

## 2022-04-01 MED ORDER — ONDANSETRON HCL 4 MG/2ML IJ SOLN: 4.0000 mg | Freq: Four times a day (QID) | INTRAMUSCULAR | Status: DC | PRN

## 2022-04-01 MED ORDER — IOHEXOL 350 MG/ML SOLN
80.0000 mL | Freq: Once | INTRAVENOUS | Status: AC | PRN
  Administered 2022-04-01: 80 mL via INTRAVENOUS

## 2022-04-01 MED ORDER — SODIUM CHLORIDE 0.9 % IV BOLUS
500.0000 mL | Freq: Once | INTRAVENOUS | Status: AC
Start: 2022-04-01 — End: 2022-04-01
  Administered 2022-04-01: 500 mL via INTRAVENOUS

## 2022-04-01 MED ORDER — HYDRALAZINE HCL 20 MG/ML IJ SOLN: 10.0000 mg | INTRAMUSCULAR | Status: DC | PRN

## 2022-04-01 MED ORDER — ONDANSETRON HCL 4 MG/2ML IJ SOLN
INTRAMUSCULAR | Status: AC
  Filled 2022-04-01: qty 2

## 2022-04-01 MED ORDER — ONDANSETRON HCL 4 MG/2ML IJ SOLN
4.0000 mg | Freq: Once | INTRAMUSCULAR | Status: AC
  Administered 2022-04-01: 4 mg via INTRAVENOUS

## 2022-04-01 MED ORDER — METOPROLOL TARTRATE 5 MG/5ML IV SOLN: 5.0000 mg | Freq: Four times a day (QID) | INTRAVENOUS | Status: DC | PRN

## 2022-04-01 MED ORDER — FENTANYL CITRATE PF 50 MCG/ML IJ SOSY: 100.0000 ug | PREFILLED_SYRINGE | Freq: Once | INTRAMUSCULAR | Status: DC
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
## 2022-04-01 NOTE — Progress Notes (Signed)
Orthopedic Tech Progress Note ?Patient Details:  ?Ramond Dial Doe ?12/02/1875 ?[id number] ? ?Patient ID: Ramond Dial Doe, male   DOB: 12/02/1875, 36 y.o.   MRN: [id number] ?Wound washed out per request of Dr. Steward Drone, RN will provide dressing. ? ?[name] ?04/18/2022, 11:35 PM ? ?

## 2022-04-01 NOTE — ED Notes (Signed)
Neo IV started @ 424mcg/min by TRN. ?

## 2022-04-01 NOTE — Progress Notes (Signed)
RT NOTE:  Pt transported to CT without event.  
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
## 2022-04-01 NOTE — Progress Notes (Signed)
Orthopedic Tech Progress Note ?Patient Details:  ?Jeffrey Michael ?12/02/1875 ?[id number] ? ?Patient ID: Jeffrey Michael, male   DOB: 12/02/1875, 36 y.o.   MRN: [id number] ?Level I; not needed at the moment. ? ?[name] ?04/24/2022, 10:05 PM ? ?

## 2022-04-02 DIAGNOSIS — Z23 Encounter for immunization: Secondary | ICD-10-CM | POA: Diagnosis present

## 2022-04-02 DIAGNOSIS — Y92411 Interstate highway as the place of occurrence of the external cause: Secondary | ICD-10-CM | POA: Diagnosis not present

## 2022-04-02 LAB — BPAM RBC
Blood Product Expiration Date: 202305222359
Blood Product Expiration Date: 202305222359
ISSUE DATE / TIME: 202305012344
ISSUE DATE / TIME: 202305012344
Unit Type and Rh: 6200
Unit Type and Rh: 6200

## 2022-04-02 LAB — COMPREHENSIVE METABOLIC PANEL
ALT: 221 U/L — ABNORMAL HIGH (ref 0–44)
AST: 317 U/L — ABNORMAL HIGH (ref 15–41)
Albumin: 3.3 g/dL — ABNORMAL LOW (ref 3.5–5.0)
Alkaline Phosphatase: 76 U/L (ref 38–126)
Anion gap: 6 (ref 5–15)
BUN: 20 mg/dL (ref 6–20)
CO2: 24 mmol/L (ref 22–32)
Calcium: 8.5 mg/dL — ABNORMAL LOW (ref 8.9–10.3)
Chloride: 112 mmol/L — ABNORMAL HIGH (ref 98–111)
Creatinine, Ser: 1.25 mg/dL — ABNORMAL HIGH (ref 0.61–1.24)
GFR, Estimated: 39 mL/min — ABNORMAL LOW (ref >60)
Glucose, Bld: 149 mg/dL — ABNORMAL HIGH (ref 70–99)
Potassium: 3.4 mmol/L — ABNORMAL LOW (ref 3.5–5.1)
Sodium: 142 mmol/L (ref 135–145)
Total Bilirubin: 0.5 mg/dL (ref 0.3–1.2)
Total Protein: 6.3 g/dL — ABNORMAL LOW (ref 6.5–8.1)

## 2022-04-02 LAB — POCT I-STAT, CHEM 8
BUN: 22 mg/dL — ABNORMAL HIGH (ref 6–20)
Calcium, Ion: 1.1 mmol/L — ABNORMAL LOW (ref 1.15–1.40)
Chloride: 108 mmol/L (ref 98–111)
Creatinine, Ser: 1.3 mg/dL — ABNORMAL HIGH (ref 0.61–1.24)
Glucose, Bld: 144 mg/dL — ABNORMAL HIGH (ref 70–99)
HCT: 36 % — ABNORMAL LOW (ref 39.0–52.0)
Hemoglobin: 12.2 g/dL — ABNORMAL LOW (ref 13.0–17.0)
Potassium: 3.3 mmol/L — ABNORMAL LOW (ref 3.5–5.1)
Sodium: 144 mmol/L (ref 135–145)
TCO2: 23 mmol/L (ref 22–32)

## 2022-04-02 LAB — TYPE AND SCREEN
ABO/RH(D): A POS
Antibody Screen: NEGATIVE
Unit division: 0
Unit division: 0

## 2022-04-02 LAB — ABO/RH: ABO/RH(D): A POS

## 2022-04-02 LAB — SAMPLE TO BLOOD BANK

## 2022-04-02 MED ORDER — CHLORHEXIDINE GLUCONATE CLOTH 2 % EX PADS: 6.0000 | MEDICATED_PAD | Freq: Every day | CUTANEOUS | Status: DC

## 2022-04-02 MED ORDER — PHENYLEPHRINE HCL-NACL 20-0.9 MG/250ML-% IV SOLN
0.0000 ug/min | INTRAVENOUS | Status: DC
  Administered 2022-04-02 (×16): 400 ug/min via INTRAVENOUS
  Filled 2022-04-02: qty 750
  Filled 2022-04-02: qty 1000
  Filled 2022-04-02: qty 250
  Filled 2022-04-02 (×3): qty 500
  Filled 2022-04-02: qty 250
  Filled 2022-04-02: qty 1000
  Filled 2022-04-02 (×2): qty 250

## 2022-04-02 MED ORDER — NOREPINEPHRINE 4 MG/250ML-% IV SOLN
INTRAVENOUS | Status: AC
Start: 2022-04-02 — End: 2022-04-02
  Filled 2022-04-02: qty 250

## 2022-04-02 MED ORDER — SODIUM CHLORIDE 0.9 % IV SOLN: INTRAVENOUS | Status: DC | PRN

## 2022-04-02 MED ORDER — NOREPINEPHRINE 4 MG/250ML-% IV SOLN
0.0000 ug/min | INTRAVENOUS | Status: DC
  Administered 2022-04-02: 20 ug/min via INTRAVENOUS
  Administered 2022-04-02 (×3): 40 ug/min via INTRAVENOUS
  Filled 2022-04-02 (×4): qty 250

## 2022-05-02 NOTE — Progress Notes (Signed)
Trauma Event Note ? ?Reason for Call : ?Assisted bedside nurse TK with pad change prior to family arrival. Cleaned patient and removed c-collar. Dr Bedelia Person able to reach sister who is coming to the hospital. CDS notified of patient status. No other needs, will continue to round on patient. ? ?Jill Side Latayna Ritchie  ?Trauma Response RN ? ?Please call TRN at [phone] for further assistance. ? ? ?  ?
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
## 2022-05-02 NOTE — Procedures (Signed)
Arterial Catheter Insertion Procedure Note ? ?Fredna Dow  ?[id number]  ?12/02/1875 ? ?Date:04/04/2022  ?Time:12:50 AM  ? ? ?Provider Performing: Inez Pilgrim  ? ? ?Procedure: Insertion of Arterial Line (21194) without US guidance ? ?Indication(s) ?Blood pressure monitoring and/or need for frequent ABGs ? ?Consent ?Unable to obtain consent due to inability to find a medical decision maker for patient.  All reasonable efforts were made.  Another independent medical provider,   , confirmed the benefits of this procedure outweigh the risks. ? ?Anesthesia ?None ? ? ?Time Out ?Verified patient identification, verified procedure, site/side was marked, verified correct patient position, special equipment/implants available, medications/allergies/relevant history reviewed, required imaging and test results available. ? ? ?Sterile Technique ?Maximal sterile technique including full sterile barrier drape, hand hygiene, sterile gown, sterile gloves, mask, hair covering, sterile ultrasound probe cover (if used). ? ? ?Procedure Description ?Area of catheter insertion was cleaned with chlorhexidine and draped in sterile fashion. Without real-time ultrasound guidance an arterial catheter was placed into the left radial artery.  Appropriate arterial tracings confirmed on monitor.   ? ? ?Complications/Tolerance ?None; patient tolerated the procedure well. ? ? ?EBL ?Minimal ? ? ?Specimen(s) ?None ? ?

## 2022-05-02 NOTE — Procedures (Signed)
Extubation Procedure Note ? ?Patient Details:   ?Name: Jeffrey Michael ?DOB: 08/28/1986 ?MRN: [id number] ?  ?Airway Documentation:  ?  ?Vent end date: 04/23/2022 Vent end time: 1950  ? ?Evaluation ? O2 sats: currently acceptable ?Complications: No apparent complications ?Patient did tolerate procedure well. ?Bilateral Breath Sounds: Clear, Diminished ?  ?No ? ?RT extubated patient at this time per MD order. No complications noted. ? ?Hiram Comber ?Apr 23, 2022, 7:55 PM ? ?

## 2022-05-02 NOTE — Progress Notes (Signed)
Time of Death 2007. Confirmed by 2 RNs ?

## 2022-05-02 NOTE — Progress Notes (Signed)
Clinical update provided to mother this AM. Discussed anticipated brain death confirmation. Mother desires some additional time with the patient and to gather additional family. Deciding about compassionate extubation.  ? ?Patient examined earlier in the day. Clinical exam c/w brain death. During discussion with mother earlier, she expressed interest in being present for clinical exam and apnea testing. On return of family to the room, I communicated my exam findings and the option for compassionate extubation vs proceeding with brain death testing. Mother elects for compassionate extubation after patient's son arrives around 7pm tonight.  ? ?Critical care time: ? ?Diamantina Monks, MD ?General and Trauma Surgery ?[facility] ? ?
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
## 2022-05-02 NOTE — TOC CAGE-AID Note (Signed)
Transition of Care (TOC) - CAGE-AID Screening ? ? ?Patient Details  ?Name: Jeffrey Michael ?MRN: [id number] ?Date of Birth: [birth year] ? ?Transition of Care (TOC) CM/SW Contact:    ?Army Melia, RN ?Phone Number: ?07-Apr-2022, 5:15 AM ? ? ? ? ?CAGE-AID Screening: ?Substance Abuse Screening unable to be completed due to: : Patient unable to participate (intubated, sedated) ? ?  ?  ?  ?  ?  ? ?  ? ?  ? ? ? ? ? ? ?
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
## 2022-05-02 NOTE — Progress Notes (Signed)
The patient's status has continued to deteriorate.  He is hypotensive despite pressor support with neo.  His pupils are now fixed and dilated.  He has no corneal reflexes.  He has no cough or gag.  He has no respiratory effort.  There is no movement of his extremities to noxious stimuli.  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 ? 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  I believe his situation is irreversible and unsalvageable.  I see no interventions that I could offer him would be appropriate. ? ? ?

## 2022-05-02 NOTE — Progress Notes (Signed)
Patient with progressive hypotension over the last 90 minutes minimally responsive to crystalloid, blood transfusion, and neosynephrine. Neuro exam has deteriorated simultaneously now with fixed/ dilated pupils, no cough or gag, no corneal reflexes. Not breathing over the vent. High urine output concerning for DI. Patient discussed with Dr. Annette Stable- exam consistent with herniation without evidence of brain/ brainstem function. Further resuscitative efforts are not indicated. Care also debriefed with bedside nursing/ RT staff. Will make DNR. Unfortunately this patient's identity remains unknown and there is no family available to discuss this with.  ?
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
## 2022-05-02 NOTE — Progress Notes (Signed)
Patient ID: Jeffrey Michael, male   DOB: 06-16-1986, 36 y.o.   MRN: [id number] ? Asked last night to see patient for orbital fracture but told by trauma this am that it is no necessary.  ?

## 2022-05-02 NOTE — Progress Notes (Addendum)
Attempted to contact patient's next of kin, sister Velna Hatchet at  [phone]. Left voicemail to call back to [facility] in regards to her brother.  ?

## 2022-05-02 DEATH — deceased

## 2022-10-09 IMAGING — CT CT ABD-PELV W/ CM
2 of 4 series · 15 of 46 positions shown, 17 images · IV contrast (agent unspecified)
Comparison: None.

CLINICAL DATA: Right lower quadrant pain and cramping. Onset 2 days
ago.

EXAM:
CT ABDOMEN AND PELVIS WITH CONTRAST
TECHNIQUE: Multidetector CT imaging of the abdomen and pelvis was performed
using the standard protocol following bolus administration of
intravenous contrast.

[Series 3: abdomen 5.0 · axial · 0.70mm/px · z∈[+756,+1166]mm · 12 of 92 slices shown, 14 images]
[im 5/92  soft-tissue]
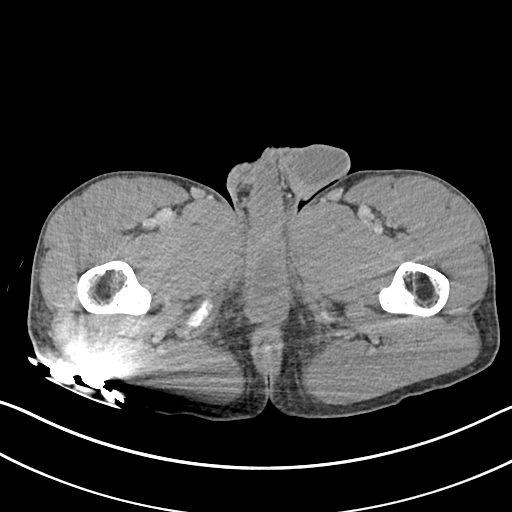
[im 5/92  bone]
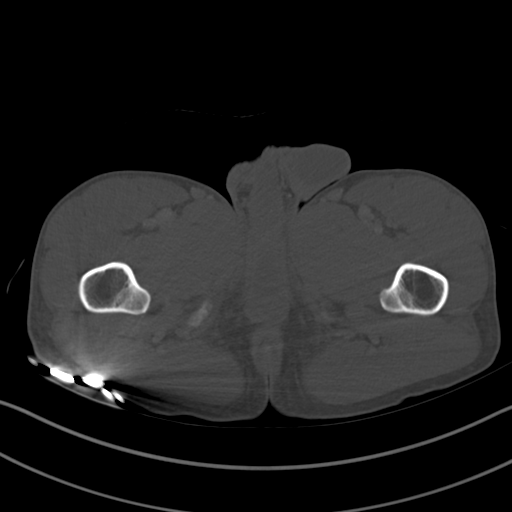
[im 14/92  soft-tissue]
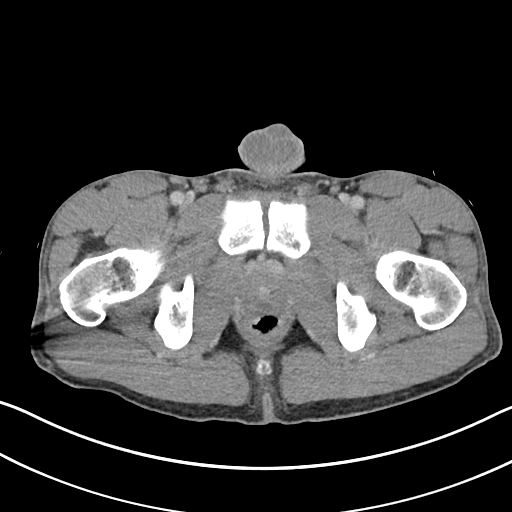
[im 19/92  soft-tissue]
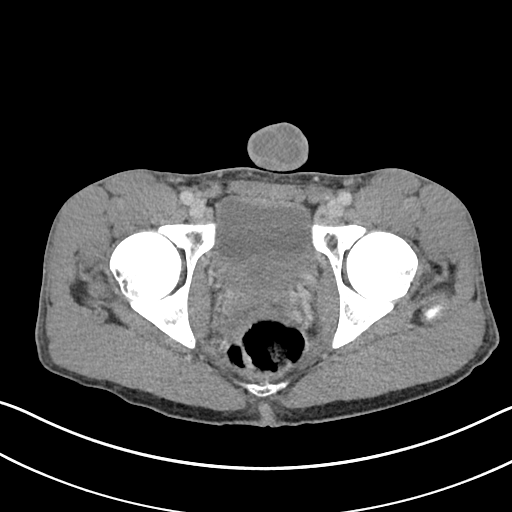
[im 28/92  soft-tissue]
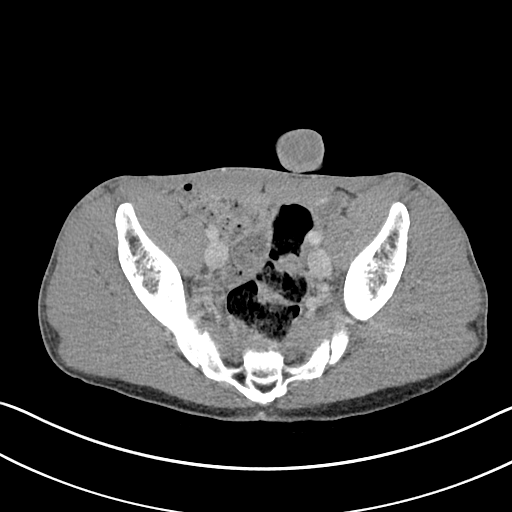
[im 37/92  soft-tissue]
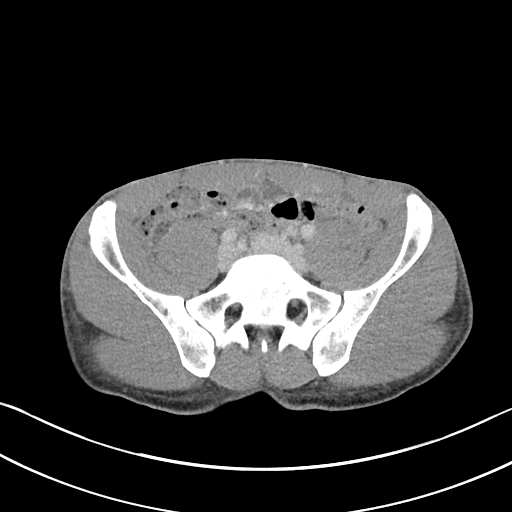
[im 41/92  soft-tissue]
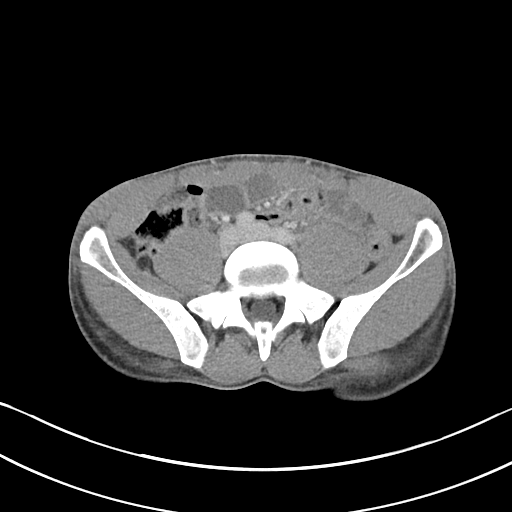
[im 51/92  soft-tissue]
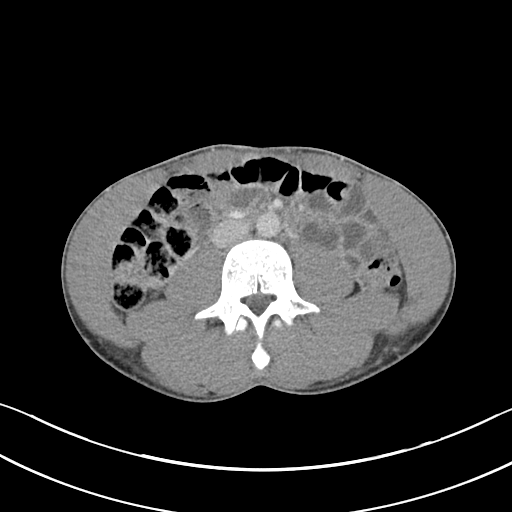
[im 55/92  soft-tissue]
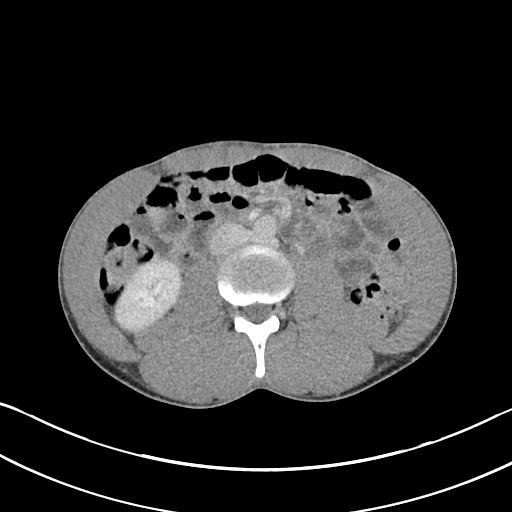
[im 64/92  soft-tissue]
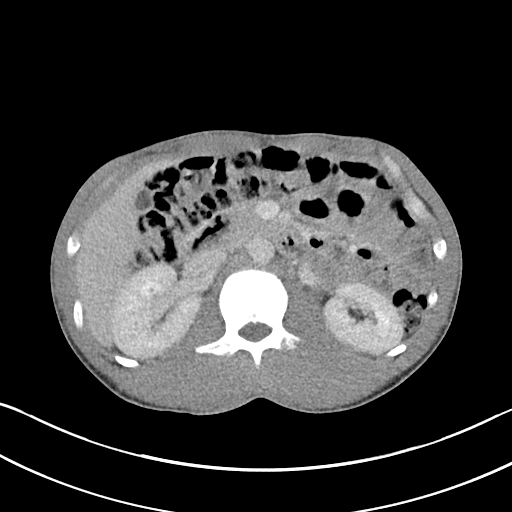
[im 64/92  bone]
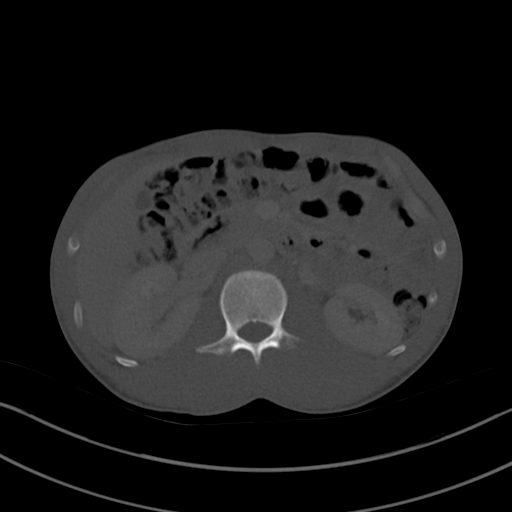
[im 73/92  soft-tissue]
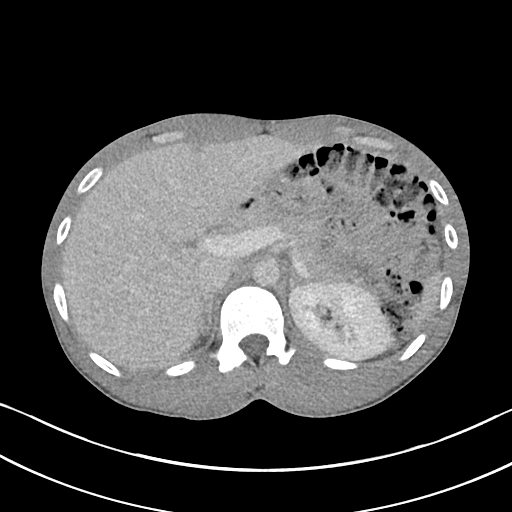
[im 78/92  soft-tissue]
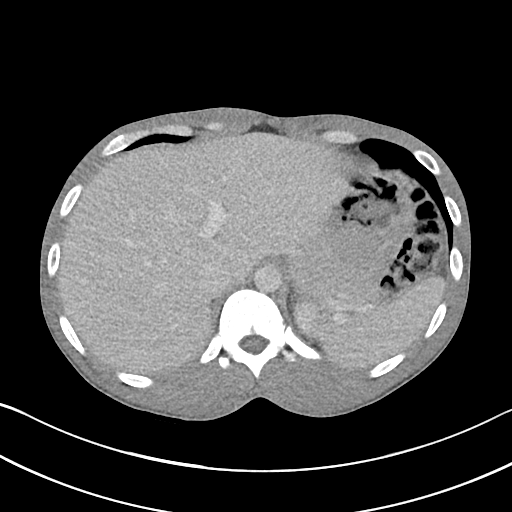
[im 87/92  soft-tissue]
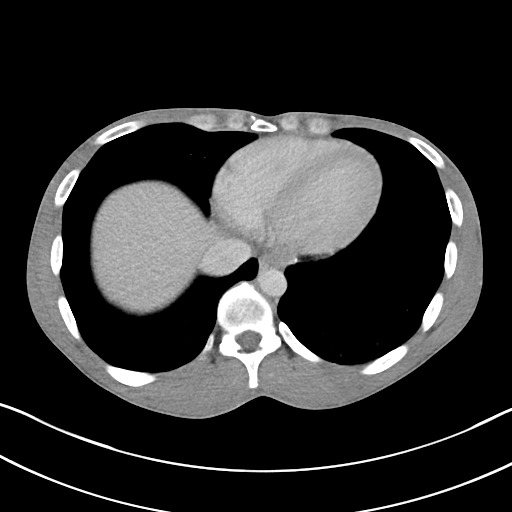

[Series 6: abdomen 3.0 mpr cor · coronal · 0.74mm/px · 3 of 75 slices shown]
[im 25/75  soft-tissue]
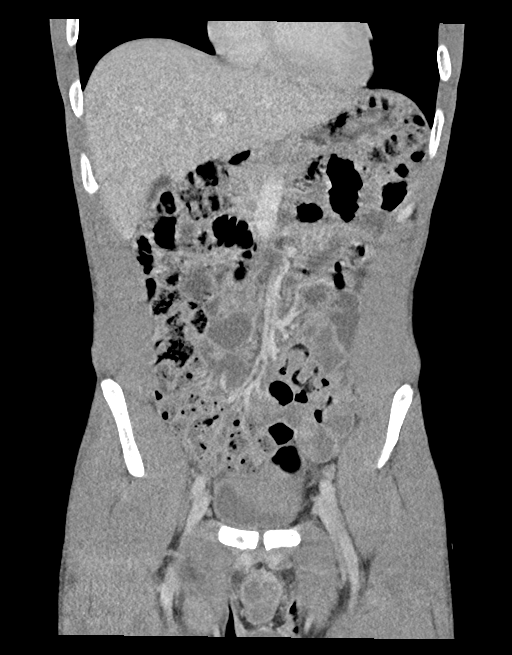
[im 33/75  soft-tissue]
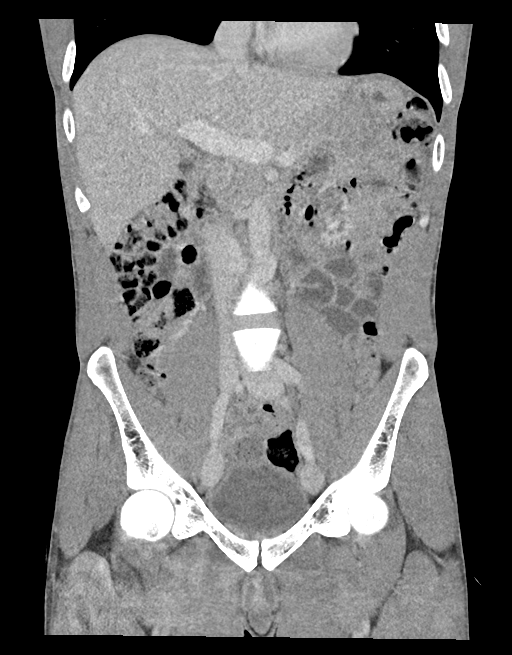
[im 42/75  soft-tissue]
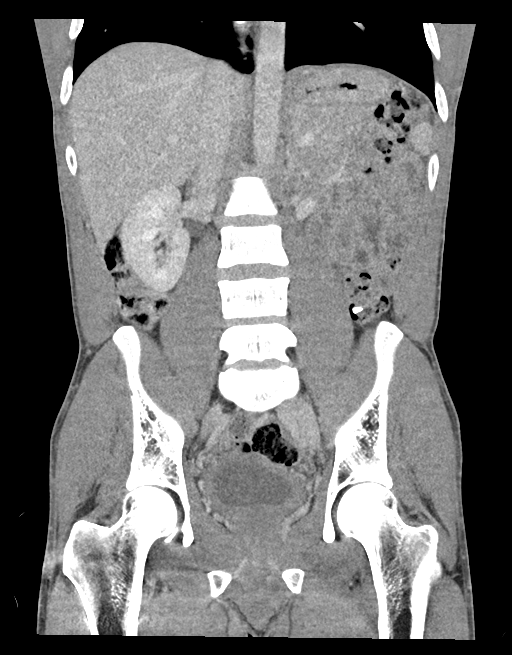

[15 of 46 positions shown; findings below may reference images not displayed]

RADIATION DOSE REDUCTION: This exam was performed according to the
departmental dose-optimization program which includes automated
exposure control, adjustment of the mA and/or kV according to
patient size and/or use of iterative reconstruction technique.

CONTRAST:  100mL OMNIPAQUE IOHEXOL 300 MG/ML  SOLN
FINDINGS: Factors affecting image quality: The patient has very little body
fat. This impairs subject contrast particularly with regard to
separation between structures in the abdominal cavity.

Lower chest: No acute abnormality.

Hepatobiliary: No liver mass enhancement is seen. The gallbladder
and bile ducts unremarkable.

Pancreas: Not well seen due to overlapping structures. No obvious
mass is seen and no ductal dilatation.

Spleen: Unremarkable.

Adrenals/Urinary Tract: There is no adrenal mass no focal renal
cortical mass. No urinary stone or obstruction is seen.

The bladder is not fully distended but could be circumferentially
thickened. If this is true wall thickening this would be unusual for
a 35-year-old.

Stomach/Bowel: Not well evaluated due to unopacified overlapping
bowel. There is fold thickening in the stomach. There is probably
fold thickening in the jejunum with slight enhancement of
fluid-filled lower abdominal small bowel. There is no dilated small
bowel. No obvious mesenteric edema but the mesentery is not well
seen. Moderate fecal stasis ascending and transverse colon noted
without focal wall thickening or diverticulitis.

Only the proximal [DATE] of the appendix is visible. The remainder is
obscured by overlapping structures. Proximal appendix is
unremarkable but distal appendicitis could be missed.

Vascular/Lymphatic: There is mild prominence of the hepatic portal
vein which measures 15.2 mm. There is a circumaortic left renal
vein.

The aorta is normal. No adenopathy is seen allowing for unopacified
small bowel.

There is pelvic venous congestion in the side walls. This is more so
on the left.

Reproductive: The prostate is not enlarged. Both testicles are in
the scrotal sac. There is no hydrocele.

Other: There is no free air, hemorrhage, free fluid or abscess.
There is no incarcerated hernia. The anterior wall is intact.

Musculoskeletal: No acute or significant osseous findings.
IMPRESSION: 1. Limited exam. The appendix is only visible proximally, the distal
appendix obscured by overlapping structures. The visualized appendix
is normal caliber.
2. Gastroenteritis without evidence of small-bowel obstruction or
pneumatosis.
3. Constipation.
4. Cystitis versus bladder nondistention.  No prostatomegaly.
5. Mildly prominent hepatic portal vein.
6. Left-greater-than-right pelvic venous congestion.
